# Patient Record
Sex: Female | Born: 1970 | Race: White | Hispanic: No | Smoking: Never smoker
Health system: Southern US, Community
[De-identification: ages and names within clinical notes are randomized; demographics above are authoritative.]

## PROBLEM LIST (undated history)

## (undated) DIAGNOSIS — I10 Essential (primary) hypertension: Secondary | ICD-10-CM

## (undated) DIAGNOSIS — G4730 Sleep apnea, unspecified: Secondary | ICD-10-CM

## (undated) DIAGNOSIS — C801 Malignant (primary) neoplasm, unspecified: Secondary | ICD-10-CM

## (undated) DIAGNOSIS — R51 Headache: Secondary | ICD-10-CM

## (undated) DIAGNOSIS — M1990 Unspecified osteoarthritis, unspecified site: Secondary | ICD-10-CM

## (undated) DIAGNOSIS — R519 Headache, unspecified: Secondary | ICD-10-CM

## (undated) DIAGNOSIS — J189 Pneumonia, unspecified organism: Secondary | ICD-10-CM

## (undated) DIAGNOSIS — K219 Gastro-esophageal reflux disease without esophagitis: Secondary | ICD-10-CM

## (undated) DIAGNOSIS — E079 Disorder of thyroid, unspecified: Secondary | ICD-10-CM

## (undated) DIAGNOSIS — R7303 Prediabetes: Secondary | ICD-10-CM

## (undated) HISTORY — PX: WISDOM TOOTH EXTRACTION: SHX21

## (undated) HISTORY — DX: Disorder of thyroid, unspecified: E07.9

## (undated) HISTORY — DX: Essential (primary) hypertension: I10

---

## 1997-11-05 HISTORY — PX: HAND TENDON SURGERY: SHX663

## 1998-12-27 ENCOUNTER — Ambulatory Visit (HOSPITAL_COMMUNITY): Admission: RE | Admit: 1998-12-27 | Discharge: 1998-12-27 | Payer: Self-pay | Admitting: Orthopedic Surgery

## 1999-03-03 ENCOUNTER — Other Ambulatory Visit: Admission: RE | Admit: 1999-03-03 | Discharge: 1999-03-03 | Payer: Self-pay | Admitting: *Deleted

## 1999-03-21 ENCOUNTER — Other Ambulatory Visit: Admission: RE | Admit: 1999-03-21 | Discharge: 1999-03-21 | Payer: Self-pay | Admitting: *Deleted

## 2000-02-29 ENCOUNTER — Other Ambulatory Visit: Admission: RE | Admit: 2000-02-29 | Discharge: 2000-02-29 | Payer: Self-pay | Admitting: *Deleted

## 2001-02-10 ENCOUNTER — Other Ambulatory Visit: Admission: RE | Admit: 2001-02-10 | Discharge: 2001-02-10 | Payer: Self-pay | Admitting: *Deleted

## 2003-01-15 ENCOUNTER — Other Ambulatory Visit: Admission: RE | Admit: 2003-01-15 | Discharge: 2003-01-15 | Payer: Self-pay | Admitting: Obstetrics and Gynecology

## 2004-03-17 ENCOUNTER — Other Ambulatory Visit: Admission: RE | Admit: 2004-03-17 | Discharge: 2004-03-17 | Payer: Self-pay | Admitting: Obstetrics and Gynecology

## 2005-03-23 ENCOUNTER — Other Ambulatory Visit: Admission: RE | Admit: 2005-03-23 | Discharge: 2005-03-23 | Payer: Self-pay | Admitting: Obstetrics and Gynecology

## 2006-03-26 ENCOUNTER — Other Ambulatory Visit: Admission: RE | Admit: 2006-03-26 | Discharge: 2006-03-26 | Payer: Self-pay | Admitting: Obstetrics & Gynecology

## 2007-04-01 ENCOUNTER — Other Ambulatory Visit: Admission: RE | Admit: 2007-04-01 | Discharge: 2007-04-01 | Payer: Self-pay | Admitting: Obstetrics & Gynecology

## 2007-10-31 ENCOUNTER — Encounter: Admission: RE | Admit: 2007-10-31 | Discharge: 2007-10-31 | Payer: Self-pay | Admitting: Obstetrics and Gynecology

## 2008-04-01 ENCOUNTER — Other Ambulatory Visit: Admission: RE | Admit: 2008-04-01 | Discharge: 2008-04-01 | Payer: Self-pay | Admitting: Obstetrics and Gynecology

## 2008-11-23 ENCOUNTER — Encounter: Admission: RE | Admit: 2008-11-23 | Discharge: 2009-02-21 | Payer: Self-pay | Admitting: Specialist

## 2010-11-26 ENCOUNTER — Encounter: Payer: Self-pay | Admitting: Obstetrics and Gynecology

## 2013-01-23 ENCOUNTER — Other Ambulatory Visit: Payer: Self-pay | Admitting: Family Medicine

## 2013-01-23 DIAGNOSIS — Z1231 Encounter for screening mammogram for malignant neoplasm of breast: Secondary | ICD-10-CM

## 2013-02-18 ENCOUNTER — Ambulatory Visit: Payer: Self-pay

## 2013-11-20 ENCOUNTER — Other Ambulatory Visit: Payer: Self-pay | Admitting: Nurse Practitioner

## 2013-11-20 NOTE — Telephone Encounter (Signed)
S/w pt and she has enough rx to last until her appt on 11/26/13. Told pt we will refill her rx for one year at her visit then. Pt agreed.

## 2013-11-26 ENCOUNTER — Ambulatory Visit (INDEPENDENT_AMBULATORY_CARE_PROVIDER_SITE_OTHER): Payer: BC Managed Care – PPO | Admitting: Nurse Practitioner

## 2013-11-26 ENCOUNTER — Encounter: Payer: Self-pay | Admitting: Nurse Practitioner

## 2013-11-26 VITALS — BP 122/84 | HR 72 | Ht 63.0 in | Wt 287.0 lb

## 2013-11-26 DIAGNOSIS — Z0000 Encounter for general adult medical examination without abnormal findings: Secondary | ICD-10-CM

## 2013-11-26 DIAGNOSIS — Z01419 Encounter for gynecological examination (general) (routine) without abnormal findings: Secondary | ICD-10-CM

## 2013-11-26 DIAGNOSIS — R42 Dizziness and giddiness: Secondary | ICD-10-CM

## 2013-11-26 DIAGNOSIS — I10 Essential (primary) hypertension: Secondary | ICD-10-CM | POA: Insufficient documentation

## 2013-11-26 DIAGNOSIS — R319 Hematuria, unspecified: Secondary | ICD-10-CM

## 2013-11-26 LAB — COMPREHENSIVE METABOLIC PANEL
ALBUMIN: 4 g/dL (ref 3.5–5.2)
ALK PHOS: 61 U/L (ref 39–117)
ALT: 17 U/L (ref 0–35)
AST: 18 U/L (ref 0–37)
BUN: 12 mg/dL (ref 6–23)
CALCIUM: 9.1 mg/dL (ref 8.4–10.5)
CO2: 27 mEq/L (ref 19–32)
Chloride: 102 mEq/L (ref 96–112)
Creat: 0.96 mg/dL (ref 0.50–1.10)
Glucose, Bld: 102 mg/dL — ABNORMAL HIGH (ref 70–99)
POTASSIUM: 4 meq/L (ref 3.5–5.3)
SODIUM: 136 meq/L (ref 135–145)
TOTAL PROTEIN: 7.1 g/dL (ref 6.0–8.3)
Total Bilirubin: 0.4 mg/dL (ref 0.3–1.2)

## 2013-11-26 LAB — TSH: TSH: 3.733 u[IU]/mL (ref 0.350–4.500)

## 2013-11-26 LAB — POCT URINALYSIS DIPSTICK
Bilirubin, UA: NEGATIVE
Glucose, UA: NEGATIVE
Ketones, UA: NEGATIVE
Leukocytes, UA: NEGATIVE
Nitrite, UA: NEGATIVE
PROTEIN UA: NEGATIVE
Urobilinogen, UA: NEGATIVE
pH, UA: 6

## 2013-11-26 LAB — LIPID PANEL
CHOL/HDL RATIO: 3.4 ratio
CHOLESTEROL: 166 mg/dL (ref 0–200)
HDL: 49 mg/dL (ref >39)
LDL CALC: 91 mg/dL (ref 0–99)
Triglycerides: 132 mg/dL (ref <150)
VLDL: 26 mg/dL (ref 0–40)

## 2013-11-26 LAB — HEMOGLOBIN, FINGERSTICK: Hemoglobin, fingerstick: 13.6 g/dL (ref 12.0–16.0)

## 2013-11-26 MED ORDER — NORETHINDRONE 0.35 MG PO TABS: 1.0000 | ORAL_TABLET | Freq: Every day | ORAL | Status: DC
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
## 2013-11-26 NOTE — Patient Instructions (Signed)

## 2013-11-27 ENCOUNTER — Encounter: Payer: Self-pay | Admitting: Nurse Practitioner

## 2013-11-27 LAB — URINE CULTURE
Colony Count: NO GROWTH
ORGANISM ID, BACTERIA: NO GROWTH

## 2013-11-27 LAB — IPS PAP TEST WITH REFLEX TO HPV

## 2013-11-27 LAB — VITAMIN D 25 HYDROXY (VIT D DEFICIENCY, FRACTURES): Vit D, 25-Hydroxy: 24 ng/mL — ABNORMAL LOW (ref 30–89)

## 2013-11-27 NOTE — Progress Notes (Signed)
Encounter reviewed by Dr. Brook Silva.  

## 2013-12-01 ENCOUNTER — Other Ambulatory Visit: Payer: Self-pay | Admitting: Nurse Practitioner

## 2013-12-01 MED ORDER — VITAMIN D (ERGOCALCIFEROL) 1.25 MG (50000 UNIT) PO CAPS: 50000.0000 [IU] | ORAL_CAPSULE | ORAL | Status: DC

## 2014-11-30 ENCOUNTER — Encounter: Payer: Self-pay | Admitting: Nurse Practitioner

## 2014-11-30 ENCOUNTER — Ambulatory Visit (INDEPENDENT_AMBULATORY_CARE_PROVIDER_SITE_OTHER): Payer: BLUE CROSS/BLUE SHIELD | Admitting: Nurse Practitioner

## 2014-11-30 VITALS — BP 144/92 | HR 64 | Ht 62.75 in | Wt 287.0 lb

## 2014-11-30 DIAGNOSIS — Z01419 Encounter for gynecological examination (general) (routine) without abnormal findings: Secondary | ICD-10-CM

## 2014-11-30 DIAGNOSIS — Z0000 Encounter for general adult medical examination without abnormal findings: Secondary | ICD-10-CM

## 2014-11-30 LAB — POCT URINALYSIS DIPSTICK
BILIRUBIN UA: NEGATIVE
GLUCOSE UA: NEGATIVE
KETONES UA: NEGATIVE
Leukocytes, UA: NEGATIVE
Nitrite, UA: NEGATIVE
PH UA: 5
Protein, UA: NEGATIVE
UROBILINOGEN UA: NEGATIVE

## 2014-11-30 LAB — LIPID PANEL
CHOLESTEROL: 176 mg/dL (ref 0–200)
HDL: 50 mg/dL (ref >39)
LDL CALC: 97 mg/dL (ref 0–99)
Total CHOL/HDL Ratio: 3.5 Ratio
Triglycerides: 147 mg/dL (ref <150)
VLDL: 29 mg/dL (ref 0–40)

## 2014-11-30 LAB — COMPREHENSIVE METABOLIC PANEL
ALBUMIN: 3.8 g/dL (ref 3.5–5.2)
ALK PHOS: 78 U/L (ref 39–117)
ALT: 21 U/L (ref 0–35)
AST: 19 U/L (ref 0–37)
BUN: 12 mg/dL (ref 6–23)
CO2: 27 mEq/L (ref 19–32)
CREATININE: 0.81 mg/dL (ref 0.50–1.10)
Calcium: 9.5 mg/dL (ref 8.4–10.5)
Chloride: 103 mEq/L (ref 96–112)
Glucose, Bld: 84 mg/dL (ref 70–99)
Potassium: 4.2 mEq/L (ref 3.5–5.3)
Sodium: 140 mEq/L (ref 135–145)
Total Bilirubin: 0.4 mg/dL (ref 0.2–1.2)
Total Protein: 7 g/dL (ref 6.0–8.3)

## 2014-11-30 LAB — HEMOGLOBIN A1C
Hgb A1c MFr Bld: 5.8 % — ABNORMAL HIGH (ref <5.7)
Mean Plasma Glucose: 120 mg/dL — ABNORMAL HIGH (ref <117)

## 2014-11-30 LAB — TSH: TSH: 2.67 u[IU]/mL (ref 0.350–4.500)

## 2014-11-30 NOTE — Patient Instructions (Signed)
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
## 2014-12-01 LAB — HEMOGLOBIN, FINGERSTICK: HEMOGLOBIN, FINGERSTICK: 13.7 g/dL (ref 12.0–16.0)

## 2014-12-01 LAB — VITAMIN D 25 HYDROXY (VIT D DEFICIENCY, FRACTURES): VIT D 25 HYDROXY: 18 ng/mL — AB (ref 30–100)

## 2014-12-02 LAB — IPS PAP TEST WITH HPV

## 2014-12-02 NOTE — Progress Notes (Signed)
Feel pt should have PUS and possible endometrial biopsy due to risks for endometrial hyperplasia.  Reviewed personally.  Felipa Emory, MD.

## 2014-12-03 MED ORDER — VITAMIN D (ERGOCALCIFEROL) 1.25 MG (50000 UNIT) PO CAPS: 50000.0000 [IU] | ORAL_CAPSULE | ORAL | Status: DC

## 2014-12-03 NOTE — Addendum Note (Signed)
Addended by: Graylon Good on: 12/03/2014 01:34 PM   Modules accepted: Orders, SmartSet

## 2015-01-03 ENCOUNTER — Telehealth: Payer: Self-pay | Admitting: Nurse Practitioner

## 2015-01-03 NOTE — Telephone Encounter (Signed)
Patient is called about recommendation for a PUS and endo biopsy per Dr. Sabra Heck,  given her risk for endo hyperplasia.  She went off POP and did not want further hormonal intervention.  She was left a message to call back.

## 2015-01-04 ENCOUNTER — Other Ambulatory Visit: Payer: Self-pay | Admitting: Nurse Practitioner

## 2015-01-04 DIAGNOSIS — N926 Irregular menstruation, unspecified: Secondary | ICD-10-CM

## 2015-01-04 NOTE — Telephone Encounter (Signed)
Patient called back and we discussed the PUS and endo biopsy.  She was not very happy to proceed if the cost was high.  She is willing to keep a menses record and report back in 6 months.  She is aware that our main concern is endometrial cancer with her not being on POP.  She allowed me to put the order in and will see if insurance covers.

## 2015-01-10 NOTE — Telephone Encounter (Signed)
Would recommend trying to schedule a 6 month follow up if pt does not proceed with PUS and biopsy instead of having her call.

## 2015-01-31 ENCOUNTER — Telehealth: Payer: Self-pay | Admitting: Nurse Practitioner

## 2015-01-31 NOTE — Telephone Encounter (Signed)
Left message that we needed a 6 months apt. for a follow up.  Colletta Maryland will also try and reach her tomorrow.

## 2015-02-01 NOTE — Telephone Encounter (Signed)
Left message again.

## 2015-03-03 NOTE — Telephone Encounter (Signed)
I have left a message several times for this pt and no answer or call back. Will close this encounter.

## 2015-05-24 ENCOUNTER — Telehealth: Payer: Self-pay | Admitting: Nurse Practitioner

## 2015-05-24 NOTE — Telephone Encounter (Signed)
Spoke with patient. Patient states that since January she has had two cycles. One in May and one in June. Advised patient I will provide Donna Cage, FNP and update on how she has been doing and return call with any further recommendations. Patient states " I really do not see myself coming in until next January for my annual. You can put it in my file and talk to her but I really do not want to come in before then."

## 2015-05-24 NOTE — Telephone Encounter (Addendum)
-----   Message from Megan Salon, MD sent at 01/10/2015  9:41 PM EST ----- Regarding: RE: PUS  I would have her schedule a 6 month follow up instead of relying on her to call back.  That's my suggestion anyway.

## 2015-12-02 ENCOUNTER — Ambulatory Visit: Payer: BLUE CROSS/BLUE SHIELD | Admitting: Nurse Practitioner

## 2016-03-22 ENCOUNTER — Telehealth: Payer: Self-pay | Admitting: *Deleted

## 2016-03-22 NOTE — Telephone Encounter (Signed)
Message left for patient to return call to [place] at [phone] on voicemail (home/mobile). [phone] (H)  RE: scheduling annual exam (due 11/2015) and mammogram.

## 2016-03-22 NOTE — Telephone Encounter (Signed)
-----   Message from Kem Boroughs, [place] sent at 02/06/2016  8:26 AM EDT ----- Please se if Mammo has been done and we just don't have a copy. ----- Message -----    From: SYSTEM    Sent: 02/04/2016  12:05 AM      To: Kem Boroughs, FNP

## 2016-04-16 NOTE — Telephone Encounter (Signed)
Left voicemail for pt re: scheduling AEX and MMG.

## 2016-04-23 NOTE — Telephone Encounter (Signed)
Letter sent today. Per Mrs Chong Sicilian.

## 2016-07-17 ENCOUNTER — Ambulatory Visit (INDEPENDENT_AMBULATORY_CARE_PROVIDER_SITE_OTHER): Payer: BLUE CROSS/BLUE SHIELD | Admitting: Obstetrics & Gynecology

## 2016-07-17 ENCOUNTER — Encounter: Payer: Self-pay | Admitting: Nurse Practitioner

## 2016-07-17 ENCOUNTER — Other Ambulatory Visit: Payer: Self-pay | Admitting: *Deleted

## 2016-07-17 ENCOUNTER — Other Ambulatory Visit: Payer: Self-pay | Admitting: Obstetrics & Gynecology

## 2016-07-17 ENCOUNTER — Ambulatory Visit (INDEPENDENT_AMBULATORY_CARE_PROVIDER_SITE_OTHER): Payer: BLUE CROSS/BLUE SHIELD

## 2016-07-17 ENCOUNTER — Ambulatory Visit (INDEPENDENT_AMBULATORY_CARE_PROVIDER_SITE_OTHER): Payer: BLUE CROSS/BLUE SHIELD | Admitting: Nurse Practitioner

## 2016-07-17 VITALS — BP 152/96 | HR 60 | Ht 62.25 in | Wt 296.0 lb

## 2016-07-17 DIAGNOSIS — R1011 Right upper quadrant pain: Secondary | ICD-10-CM | POA: Diagnosis not present

## 2016-07-17 DIAGNOSIS — R938 Abnormal findings on diagnostic imaging of other specified body structures: Secondary | ICD-10-CM

## 2016-07-17 DIAGNOSIS — N939 Abnormal uterine and vaginal bleeding, unspecified: Secondary | ICD-10-CM

## 2016-07-17 DIAGNOSIS — Z01419 Encounter for gynecological examination (general) (routine) without abnormal findings: Secondary | ICD-10-CM

## 2016-07-17 DIAGNOSIS — I10 Essential (primary) hypertension: Secondary | ICD-10-CM | POA: Diagnosis not present

## 2016-07-17 DIAGNOSIS — R9389 Abnormal findings on diagnostic imaging of other specified body structures: Secondary | ICD-10-CM

## 2016-07-17 DIAGNOSIS — Z0000 Encounter for general adult medical examination without abnormal findings: Secondary | ICD-10-CM | POA: Diagnosis not present

## 2016-07-17 DIAGNOSIS — N938 Other specified abnormal uterine and vaginal bleeding: Secondary | ICD-10-CM

## 2016-07-17 DIAGNOSIS — N8502 Endometrial intraepithelial neoplasia [EIN]: Secondary | ICD-10-CM

## 2016-07-17 LAB — POCT URINALYSIS DIPSTICK
BILIRUBIN UA: NEGATIVE
GLUCOSE UA: NEGATIVE
KETONES UA: NEGATIVE
Leukocytes, UA: NEGATIVE
Nitrite, UA: NEGATIVE
Urobilinogen, UA: NEGATIVE
pH, UA: 5.5
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
## 2016-07-17 NOTE — Patient Instructions (Signed)

## 2016-07-17 NOTE — Progress Notes (Signed)
45 y.o. Donna Melendez here for a pelvic ultrasound with sonohystogram due to continued irregular bleeding for over 6 months.  Pt was seen earlier in the day by Kem Boroughs.  Ultrasound and possible biopsy was recommended over a year ago due to bleeding pattern and her obesity.  Pt declined at that time.  Bleeding pattern has worsened and Kem Boroughs, FNP, was concerned if pt did not have additional evaluation today that she would not return for many more months.  Pt reports at times her bleeding has a really unusual odor to it.  She was previously on POP but has stopped them.    Patient's last menstrual period was 05/18/2016 (exact date).  Contraception: vasectomy  Technique:  Both transabdominal and transvaginal ultrasound examinations of the pelvis were performed. Transabdominal technique was performed for global imaging of the pelvis including uterus, ovaries, adnexal regions, and pelvic cul-de-sac.  It was necessary to proceed with endovaginal exam following the abdominal ultrasound transabdominal exam to visualize the endometrium and adnexa.  Color and duplex Doppler ultrasound was utilized to evaluate blood flow to the ovaries.   FINDINGS: Uterus: 9.2 x 4.8 x 4.0cm Endometrium: 18.64mm, appears to be an endometrial lesion present Adnexa:  Left: 2.9 x 2.0 x 1.5cm     Right: 3.2 x 1.9 x 1.4cm Cul de sac: no free fluid  SHSG:  Because of appearance of endometrium, recommended pt proceed with SHGM to determine if there is a polyp or if this is just thickened endometrium.  After obtaining appropriate verbal consent from patient, the cervix was visualized using a speculum, and prepped with betadine.  A tenaculum  was applied to the cervix.  Dilation of the cervix was not necessary. The catheter was passed into the uterus and sterile saline introduced, with the following findings:  No intracavity lesion noted, just echogenic thickened endometrium.  Endometrial biopsy recommended. Consent  obtained.  Speculum placed.  Cervix visualized and cleansed with betadine prep.  A single toothed tenaculum was applied to the anterior lip of the cervix.  Endometrial pipelle was advanced through the cervix into the endometrial cavity without difficulty.  Pipelle passed to 9cm.  Suction applied and pipelle removed with good tissue sample obtained.  Two passes returned because tissue also appeared to have blood associated with it.  Tenculum removed.  Minimal bleeding noted.  Patient tolerated procedure well.  All instruments removed.  Assessment:  45 yo G0 with 5mm thickened, echogenic endometrium Obesity Irregular bleeding  Plan:  Pt will be informed of results.  Likely will need additional procedure like D&C if pathology is negative or with hyperplasia to ensure no additional abnormality is present due to thickness of endometrium today.  Have already discussed this with pt who states "I have some vacation in December and I'd like to wait until then".  I have discussed with pt whatever is needed, it is likely that doing it sooner than December will be much more advisable.    ~15 minutes spent with patient >50% of time was in face to face discussion of above.

## 2016-07-17 NOTE — Progress Notes (Signed)
Reviewed personally.  M. Suzanne Lurie Mullane, MD.  

## 2016-07-18 ENCOUNTER — Encounter: Payer: Self-pay | Admitting: Obstetrics & Gynecology

## 2016-07-18 DIAGNOSIS — R9389 Abnormal findings on diagnostic imaging of other specified body structures: Secondary | ICD-10-CM | POA: Insufficient documentation

## 2016-07-18 LAB — LIPID PANEL
CHOL/HDL RATIO: 3.9 ratio (ref <=5.0)
Cholesterol: 204 mg/dL — ABNORMAL HIGH (ref 125–200)
HDL: 52 mg/dL (ref >=46)
LDL CALC: 130 mg/dL — AB (ref <130)
Triglycerides: 112 mg/dL (ref <150)
VLDL: 22 mg/dL (ref <30)

## 2016-07-18 LAB — COMPREHENSIVE METABOLIC PANEL
ALBUMIN: 4.1 g/dL (ref 3.6–5.1)
ALT: 30 U/L — ABNORMAL HIGH (ref 6–29)
AST: 24 U/L (ref 10–30)
Alkaline Phosphatase: 80 U/L (ref 33–115)
BUN: 13 mg/dL (ref 7–25)
CHLORIDE: 104 mmol/L (ref 98–110)
CO2: 27 mmol/L (ref 20–31)
CREATININE: 0.87 mg/dL (ref 0.50–1.10)
Calcium: 9.9 mg/dL (ref 8.6–10.2)
Glucose, Bld: 87 mg/dL (ref 65–99)
POTASSIUM: 4 mmol/L (ref 3.5–5.3)
SODIUM: 141 mmol/L (ref 135–146)
TOTAL PROTEIN: 7.6 g/dL (ref 6.1–8.1)
Total Bilirubin: 0.6 mg/dL (ref 0.2–1.2)

## 2016-07-18 LAB — VITAMIN D 25 HYDROXY (VIT D DEFICIENCY, FRACTURES): Vit D, 25-Hydroxy: 19 ng/mL — ABNORMAL LOW (ref 30–100)

## 2016-07-18 LAB — HEMOGLOBIN, FINGERSTICK: Hemoglobin, fingerstick: 14.5 g/dL (ref 12.0–16.0)

## 2016-07-18 LAB — CBC
HEMATOCRIT: 43.7 % (ref 35.0–45.0)
Hemoglobin: 14.4 g/dL (ref 11.7–15.5)
MCH: 28.5 pg (ref 27.0–33.0)
MCHC: 33 g/dL (ref 32.0–36.0)
MCV: 86.4 fL (ref 80.0–100.0)
MPV: 9.8 fL (ref 7.5–12.5)
Platelets: 325 10*3/uL (ref 140–400)
RBC: 5.06 MIL/uL (ref 3.80–5.10)
RDW: 13.9 % (ref 11.0–15.0)
WBC: 10.9 10*3/uL — AB (ref 3.8–10.8)

## 2016-07-18 LAB — TSH: TSH: 3.81 mIU/L

## 2016-07-18 LAB — HEMOGLOBIN A1C
HEMOGLOBIN A1C: 5.8 % — AB (ref <5.7)
Mean Plasma Glucose: 120 mg/dL

## 2016-07-19 ENCOUNTER — Ambulatory Visit: Payer: BLUE CROSS/BLUE SHIELD | Admitting: Obstetrics & Gynecology

## 2016-07-19 MED ORDER — VITAMIN D (ERGOCALCIFEROL) 1.25 MG (50000 UNIT) PO CAPS: 50000.0000 [IU] | ORAL_CAPSULE | ORAL | 1 refills | Status: DC

## 2016-07-19 NOTE — Addendum Note (Signed)
Addended by: Graylon Good on: 07/19/2016 09:31 AM   Modules accepted: Orders

## 2016-07-20 LAB — IPS PAP TEST WITH HPV

## 2016-08-01 ENCOUNTER — Telehealth: Payer: Self-pay

## 2016-08-01 NOTE — Telephone Encounter (Signed)
-----   Message from Megan Salon, MD sent at 08/01/2016  6:31 AM EDT ----- Finally was able to reach pt on phone during time she was able to talk.  D/w pt findings and that although this did not show endometrial cancer, there are concerning findings for this.  Risk is at least 25% that malignancy is present.  Likely this is higher with her.  Also, endometrium was 36mm on ultrasound and there is much more tissue present that could have malignancy in it.  Pt and I discussed proceeding with D&C for definitive diagnosis vs referral to gyn/onc for consultation for hysterectomy.  She would have frozen section and then proceed with staging if necessary.  As she is 60, this would mean removal of ovaries as well.  She is not desirous of HRT, if possible.  Pt asks "why can't you just jerk it out and then I'll have another surgery if needed for staging?".  Advised recovery would be someone similar and she is already worried about having to take time off from work so would not recommend this approach.  Pt voices that she only has enough days left of vacation for her Thanksgiving and Christmas holidays so she is NOT going to have this done until December.  Advised of typical standard of care.  Pt is aware but is adamant about timing of surgery.  At least 45 minutes spent on phone with pt answering questions.  Referral to gyn/onc placed.  Riley Kill, will you please call for appt for no earlier than next week.  Pt states she's "too busy" this week to go.  Declines appt that is earlier than 10am.  Will CC to Kem Boroughs.

## 2016-08-01 NOTE — Addendum Note (Signed)
Addended by: Megan Salon on: 08/01/2016 06:33 AM   Modules accepted: Orders

## 2016-08-01 NOTE — Telephone Encounter (Signed)
Left message with the scheduling department at [facility] at [facility] with request to return call regarding scheduling a patient appointment with [facility].

## 2016-08-02 NOTE — Telephone Encounter (Signed)
Patient would like to cancel her referral to [facility].  Patient no longer wants this referral.

## 2016-08-02 NOTE — Telephone Encounter (Signed)
Spoke with patient. Patient would like to cancel referral to Dr.Rossi's office at this time. Patient states that she has decided that she does not want to proceed with any further evaluation at this time. "I am not concerned that I have cancer and I do not want to do anything further." Advised patient there is at least 25 % chance that malignancy is present and that per Dr.Miller this is likely higher with her. Advised it is against our recommendation not to proceed with any evaluation. Strongly encouraged patient to proceed with evaluation. Patient declines. "I am set in my decision and it is not going to change." Advised I will let Dr.Miller know that she is declining further evaluation at this time.  Dr.Miller, please advise. I have not yet cancelled referral to [facility], but can if needed.

## 2016-08-07 ENCOUNTER — Telehealth: Payer: Self-pay | Admitting: Nurse Practitioner

## 2016-08-07 NOTE — Telephone Encounter (Signed)
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  Offered that since she has 3 weeks at christmas to do the [facility] then.  She wanted more definitive treatment and knew that 3 weeks was not enough time to recover.  But also states her vacation times starts all over first of year.  She has great concerns about out of pocket cost and does not want to pursue at this time. (Current bill is more than she can pay).   Again made aware that at her age we were concerned about a cancer that could take her life.  She states she is not afraid or an anxious person and has no concerns about cancer.  I did offer to remake her apt with Dr. Denman George to review options and discuss surgery more - she declines.  She is very appreciative of our call and concern for her.  I have asked her to call if any thing changes or any problems.

## 2016-08-08 NOTE — Telephone Encounter (Signed)
Certified letter will be sent.  Encounter closed.

## 2016-10-09 ENCOUNTER — Telehealth: Payer: Self-pay | Admitting: Obstetrics & Gynecology

## 2016-10-09 NOTE — Telephone Encounter (Signed)
Call to [place] office for appointment, left message on voice mail requesting first available appointment.

## 2016-10-09 NOTE — Telephone Encounter (Signed)
Patient wants to know if she can schedule surgery before Christmas.

## 2016-10-09 NOTE — Telephone Encounter (Signed)
Pt had biopsy in September showing "at least complex endometrial hyperplasia with atypia".  Pathologist was very concerned it was more than this.  I spoke with pt personally.  This is the note from my phone call with here that is documented on her pathology report:  "Finally was able to reach pt on phone during time she was able to talk. D/w pt findings and that although this did not show endometrial cancer, there are concerning findings for this. Risk is at least 25% that malignancy is present. Likely this is higher with her. Also, endometrium was 18mm on ultrasound and there is much more tissue present that could have malignancy in it. Pt and I discussed proceeding with D&C for definitive diagnosis vs referral to gyn/onc for consultation for hysterectomy. She would have frozen section and then proceed with staging if necessary. As she is 19, this would mean removal of ovaries as well. She is not desirous of HRT, if possible. Pt asks "why can't you just jerk it out and then I'll have another surgery if needed for staging?". Advised recovery would be someone similar and she is already worried about having to take time off from work so would not recommend this approach. Pt voices that she only has enough days left of vacation for her Thanksgiving and Christmas holidays so she is NOT going to have this done until December. Advised of typical standard of care. Pt is aware but is adamant about timing of surgery. At least 45 minutes spent on phone with pt answering questions."  Pt later called and declined to do anything.  She was called an additional time and, again, declined.    So, she needs to see oncology because she is likely going to have pelvic LND done while waiting on frozen section.  She will not have a D&C for definitive diagnosis before hysterectomy.  We will need to explain this to gyn/onc.

## 2016-10-09 NOTE — Telephone Encounter (Signed)
Call to patient. She states she is ready to proceed with hysterectomy as soon as possible.  Has only had about 5 days of no bleeding since last appointment here in September.  Initially did not want to proceed with surgery but states bleeding has continued and she is ready. She is closer to deductible and has "enough" time off work.  Needs to proceed with surgery before Christmas to maximize time off for recovery. Advised patient that with limited time left, this may not be a viable option. Advised will review with MD for instructions and call her back.

## 2016-10-10 NOTE — Telephone Encounter (Signed)
Spoke to [place] at Dr Serita Grit office, first available appointment is 10-22-16 at 0900. She will review clinical info with [facility] tomorrow and call back if any changes.   Call to patient at approximately 1650. Advised of appointment with Dr Denman George on 10-22-16 at 0900. This is first available appointment at this time. If able to provide any additional information or changes, they will call back. Patient is aware of appointment date and time and initially declined due to work. Advised that if desires surgery quickly, will need to take appointment when available but she is welcome to call to reschedule. Patient stated she will keep appointment.  Routing to provider for final review. Patient agreeable to disposition. Will close encounter.

## 2016-10-10 NOTE — Telephone Encounter (Signed)
2nd call to [place] for appointment. Left message to call back.

## 2016-10-16 ENCOUNTER — Telehealth: Payer: Self-pay | Admitting: Obstetrics & Gynecology

## 2016-10-16 NOTE — Telephone Encounter (Signed)
Spoke with patient. Patient calling for location of appt with Dr. Denman George on 10/22/16. Provided patient with address to [place] at [facility] and advised to stop at desk in main lobby for further instructions for check-in. Patient asking if Dr. Denman George will need any additional records? Advised patient Dr. Denman George will have information that is needed prior to visit. Patient verbalizes understanding and is agreeable.  Routing to provider for final review. Patient is agreeable to disposition. Will close encounter.   Cc: Jaymes Graff

## 2016-10-16 NOTE — Telephone Encounter (Signed)
Patient is asking to talk with Dr.Miller's nurse about the referral that was made for her by our office.

## 2016-10-18 ENCOUNTER — Telehealth: Payer: Self-pay | Admitting: *Deleted

## 2016-10-18 DIAGNOSIS — E559 Vitamin D deficiency, unspecified: Secondary | ICD-10-CM

## 2016-10-18 NOTE — Telephone Encounter (Signed)
-----   Message from Graylon Good, [place] sent at 07/19/2016 10:30 AM EDT ----- Regarding: repeat labs Needs vit d recheck 3 months from 07/19/16. meds went to mail order pharm.

## 2016-10-18 NOTE — Telephone Encounter (Signed)
I spoke to the patient to let her know it is time to repeat her Vitamin D.  Donna Melendez is scheduled to see Dr. Denman George on the 18th.  Patient is given the option to schedule at our office to have labs drawn or to wait until appointment on Monday to see if any labs will be drawn and if Vitamin D can be added.  Patient opts to wait until Monday.  Order entered for repeat Vit D.  Routing to provider for review.  Signing encounter.

## 2016-10-22 ENCOUNTER — Encounter: Payer: Self-pay | Admitting: Gynecologic Oncology

## 2016-10-22 ENCOUNTER — Ambulatory Visit: Payer: BLUE CROSS/BLUE SHIELD | Attending: Gynecologic Oncology | Admitting: Gynecologic Oncology

## 2016-10-22 VITALS — BP 213/128 | HR 103 | Temp 98.7°F | Resp 18 | Ht 62.25 in | Wt 294.6 lb

## 2016-10-22 DIAGNOSIS — N970 Female infertility associated with anovulation: Secondary | ICD-10-CM | POA: Insufficient documentation

## 2016-10-22 DIAGNOSIS — N939 Abnormal uterine and vaginal bleeding, unspecified: Secondary | ICD-10-CM | POA: Insufficient documentation

## 2016-10-22 DIAGNOSIS — C541 Malignant neoplasm of endometrium: Secondary | ICD-10-CM | POA: Diagnosis not present

## 2016-10-22 DIAGNOSIS — N8502 Endometrial intraepithelial neoplasia [EIN]: Secondary | ICD-10-CM

## 2016-10-22 NOTE — Patient Instructions (Signed)
Preparing for your Surgery  Plan for surgery on 10/30/16 with Dr. Everitt Amber Robotic Total hysterectomy with bilateral salpingo-oophorectomy with sentinel lympnode biopsy  Pre-operative Testing -You will receive a phone call from presurgical testing at [facility] to arrange for a pre-operative testing appointment before your surgery.  This appointment normally occurs one to two weeks before your scheduled surgery.   -Bring your insurance card, copy of an advanced directive if applicable, medication list  -At that visit, you will be asked to sign a consent for a possible blood transfusion in case a transfusion becomes necessary during surgery.  The need for a blood transfusion is rare but having consent is a necessary part of your care.     -You should not be taking blood thinners or aspirin at least ten days prior to surgery unless instructed by your surgeon.  Day Before Surgery at [place] will be asked to take in a light diet the day before surgery.  Avoid carbonated beverages.  You will be advised to have nothing to eat or drink after midnight the evening before.     Eat a light diet the day before surgery.  Examples including soups, broths,  toast, yogurt, mashed potatoes.  Things to avoid include carbonated beverages  (fizzy beverages), raw fruits and raw vegetables, or beans.    If your bowels are filled with gas, your surgeon will have difficulty  visualizing your pelvic organs which increases your surgical risks.  Your role in recovery Your role is to become active as soon as directed by your doctor, while still giving yourself time to heal.  Rest when you feel tired. You will be asked to do the following in order to speed your recovery:  - Cough and breathe deeply. This helps toclear and expand your lungs and can prevent pneumonia. You may be given a spirometer to practice deep breathing. A staff member will show you how to use the spirometer. - Do mild  physical activity. Walking or moving your legs help your circulation and body functions return to normal. A staff member will help you when you try to walk and will provide you with simple exercises. Do not try to get up or walk alone the first time. - Actively manage your pain. Managing your pain lets you move in comfort. We will ask you to rate your pain on a scale of zero to 10. It is your responsibility to tell your doctor or nurse where and how much you hurt so your pain can be treated.  Special Considerations -If you are diabetic, you may be placed on insulin after surgery to have closer control over your blood sugars to promote healing and recovery.  This does not mean that you will be discharged on insulin.  If applicable, your oral antidiabetics will be resumed when you are tolerating a solid diet.  -Your final pathology results from surgery should be available by the Friday after surgery and the results will be relayed to you when available.  Eat a light diet the day before surgery.  Examples including soups, broths, toast, yogurt, mashed potatoes.  Things to avoid include carbonated beverages (fizzy beverages), raw fruits and raw vegetables, or beans.   If your bowels are filled with gas, your surgeon will have difficulty visualizing your pelvic organs which increases your surgical risks. Blood Transfusion Information WHAT IS A BLOOD TRANSFUSION? A transfusion is the replacement of blood or some of its parts. Blood is made up of multiple cells  which provide different functions.  Red blood cells carry oxygen and are used for blood loss replacement.  White blood cells fight against infection.  Platelets control bleeding.  Plasma helps clot blood.  Other blood products are available for specialized needs, such as hemophilia or other clotting disorders. BEFORE THE TRANSFUSION  Who gives blood for transfusions?   You may be able to donate blood to be used at a later date on yourself  (autologous donation).  Relatives can be asked to donate blood. This is generally not any safer than if you have received blood from a stranger. The same precautions are taken to ensure safety when a relative's blood is donated.  Healthy volunteers who are fully evaluated to make sure their blood is safe. This is blood bank blood. Transfusion therapy is the safest it has ever been in the practice of medicine. Before blood is taken from a donor, a complete history is taken to make sure that person has no history of diseases nor engages in risky social behavior (examples are intravenous drug use or sexual activity with multiple partners). The donor's travel history is screened to minimize risk of transmitting infections, such as malaria. The donated blood is tested for signs of infectious diseases, such as HIV and hepatitis. The blood is then tested to be sure it is compatible with you in order to minimize the chance of a transfusion reaction. If you or a relative donates blood, this is often done in anticipation of surgery and is not appropriate for emergency situations. It takes many days to process the donated blood. RISKS AND COMPLICATIONS Although transfusion therapy is very safe and saves many lives, the main dangers of transfusion include:   Getting an infectious disease.  Developing a transfusion reaction. This is an allergic reaction to something in the blood you were given. Every precaution is taken to prevent this. The decision to have a blood transfusion has been considered carefully by your caregiver before blood is given. Blood is not given unless the benefits outweigh the risks.
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
## 2016-10-23 NOTE — Progress Notes (Signed)
Scheduling pre op- please PLACE SURGICAL ORDERS IN EPIC  Thanks 

## 2016-10-23 NOTE — Patient Instructions (Signed)
Donna Melendez  10/23/2016   Your procedure is scheduled on: 10/30/2016    Report to [facility] Main  Entrance take [place]  elevators to 3rd floor to  [place] at   [place] AM.  Call this number if you have problems the morning of surgery [phone]   Remember: ONLY 1 PERSON MAY GO WITH YOU TO SHORT STAY TO GET  READY MORNING OF YOUR SURGERY.  Do not eat food or drink liquids :After Midnight.             Eat a light diet the day before surgery.  Examples include: soups, broths, toast, yogurt and mashed potatoes.  Things to avoid include carbonated beverages, raw fruits and vegetables and beans.       Take these medicines the morning of surgery with A SIP OF WATER: Propanolol ( Inderal)                                 You may not have any metal on your body including hair pins and              piercings  Do not wear jewelry, make-up, lotions, powders or perfumes, deodorant             Do not wear nail polish.  Do not shave  48 hours prior to surgery.               Do not bring valuables to the hospital. [place].  Contacts, dentures or bridgework may not be worn into surgery.  Leave suitcase in the car. After surgery it may be brought to your room.       Special Instructions: coughing and deep breathing exercises, leg exercises               Please read over the following fact sheets you were given: _____________________________________________________________________             [facility] - Preparing for Surgery Before surgery, you can play an important role.  Because skin is not sterile, your skin needs to be as free of germs as possible.  You can reduce the number of germs on your skin by washing with CHG (chlorahexidine gluconate) soap before surgery.  CHG is an antiseptic cleaner which kills germs and bonds with the skin to continue killing germs even after washing. Please DO NOT use if you  have an allergy to CHG or antibacterial soaps.  If your skin becomes reddened/irritated stop using the CHG and inform your nurse when you arrive at Short Stay. Do not shave (including legs and underarms) for at least 48 hours prior to the first CHG shower.  You may shave your face/neck. Please follow these instructions carefully:  1.  Shower with CHG Soap the night before surgery and the  morning of Surgery.  2.  If you choose to wash your hair, wash your hair first as usual with your  normal  shampoo.  3.  After you shampoo, rinse your hair and body thoroughly to remove the  shampoo.                           4.  Use CHG as you would any other liquid soap.  You can apply chg directly  to the skin and wash                       Gently with a scrungie or clean washcloth.  5.  Apply the CHG Soap to your body ONLY FROM THE NECK DOWN.   Do not use on face/ open                           Wound or open sores. Avoid contact with eyes, ears mouth and genitals (private parts).                       Wash face,  Genitals (private parts) with your normal soap.             6.  Wash thoroughly, paying special attention to the area where your surgery  will be performed.  7.  Thoroughly rinse your body with warm water from the neck down.  8.  DO NOT shower/wash with your normal soap after using and rinsing off  the CHG Soap.                9.  Pat yourself dry with a clean towel.            10.  Wear clean pajamas.            11.  Place clean sheets on your bed the night of your first shower and do not  sleep with pets. Day of Surgery : Do not apply any lotions/deodorants the morning of surgery.  Please wear clean clothes to the hospital/surgery center.  FAILURE TO FOLLOW THESE INSTRUCTIONS MAY RESULT IN THE CANCELLATION OF YOUR SURGERY PATIENT SIGNATURE_________________________________  NURSE  SIGNATURE__________________________________  ________________________________________________________________________  WHAT IS A BLOOD TRANSFUSION? Blood Transfusion Information  A transfusion is the replacement of blood or some of its parts. Blood is made up of multiple cells which provide different functions.  Red blood cells carry oxygen and are used for blood loss replacement.  White blood cells fight against infection.  Platelets control bleeding.  Plasma helps clot blood.  Other blood products are available for specialized needs, such as hemophilia or other clotting disorders. BEFORE THE TRANSFUSION  Who gives blood for transfusions?   Healthy volunteers who are fully evaluated to make sure their blood is safe. This is blood bank blood. Transfusion therapy is the safest it has ever been in the practice of medicine. Before blood is taken from a donor, a complete history is taken to make sure that person has no history of diseases nor engages in risky social behavior (examples are intravenous drug use or sexual activity with multiple partners). The donor's travel history is screened to minimize risk of transmitting infections, such as malaria. The donated blood is tested for signs of infectious diseases, such as HIV and hepatitis. The blood is then tested to be sure it is compatible with you in order to minimize the chance of a transfusion reaction. If you or a relative donates blood, this is often done in anticipation of surgery and is not appropriate for emergency situations. It takes many days to process the donated blood. RISKS AND COMPLICATIONS Although transfusion therapy is very safe and saves many lives, the main dangers of transfusion include:   Getting an infectious disease.  Developing a transfusion reaction. This is an  allergic reaction to something in the blood you were given. Every precaution is taken to prevent this. The decision to have a blood transfusion has been  considered carefully by your caregiver before blood is given. Blood is not given unless the benefits outweigh the risks. AFTER THE TRANSFUSION  Right after receiving a blood transfusion, you will usually feel much better and more energetic. This is especially true if your red blood cells have gotten low (anemic). The transfusion raises the level of the red blood cells which carry oxygen, and this usually causes an energy increase.  The nurse administering the transfusion will monitor you carefully for complications. HOME CARE INSTRUCTIONS  No special instructions are needed after a transfusion. You may find your energy is better. Speak with your caregiver about any limitations on activity for underlying diseases you may have. SEEK MEDICAL CARE IF:   Your condition is not improving after your transfusion.  You develop redness or irritation at the intravenous (IV) site. SEEK IMMEDIATE MEDICAL CARE IF:  Any of the following symptoms occur over the next 12 hours:  Shaking chills.  You have a temperature by mouth above 102 F (38.9 C), not controlled by medicine.  Chest, back, or muscle pain.  People around you feel you are not acting correctly or are confused.  Shortness of breath or difficulty breathing.  Dizziness and fainting.  You get a rash or develop hives.  You have a decrease in urine output.  Your urine turns a dark color or changes to pink, red, or brown. Any of the following symptoms occur over the next 10 days:  You have a temperature by mouth above 102 F (38.9 C), not controlled by medicine.  Shortness of breath.  Weakness after normal activity.  The white part of the eye turns yellow (jaundice).  You have a decrease in the amount of urine or are urinating less often.  Your urine turns a dark color or changes to pink, red, or brown. Document Released: 10/19/2000 Document Revised: 01/14/2012 Document Reviewed: 06/07/2008 ExitCare Patient Information 2014  [place].  _______________________________________________________________________  Incentive Spirometer  An incentive spirometer is a tool that can help keep your lungs clear and active. This tool measures how well you are filling your lungs with each breath. Taking long deep breaths may help reverse or decrease the chance of developing breathing (pulmonary) problems (especially infection) following:  A long period of time when you are unable to move or be active. BEFORE THE PROCEDURE   If the spirometer includes an indicator to show your best effort, your nurse or respiratory therapist will set it to a desired goal.  If possible, sit up straight or lean slightly forward. Try not to slouch.  Hold the incentive spirometer in an upright position. INSTRUCTIONS FOR USE  1. Sit on the edge of your bed if possible, or sit up as far as you can in bed or on a chair. 2. Hold the incentive spirometer in an upright position. 3. Breathe out normally. 4. Place the mouthpiece in your mouth and seal your lips tightly around it. 5. Breathe in slowly and as deeply as possible, raising the piston or the ball toward the top of the column. 6. Hold your breath for 3-5 seconds or for as long as possible. Allow the piston or ball to fall to the bottom of the column. 7. Remove the mouthpiece from your mouth and breathe out normally. 8. Rest for a few seconds and repeat Steps 1 through 7  at least 10 times every 1-2 hours when you are awake. Take your time and take a few normal breaths between deep breaths. 9. The spirometer may include an indicator to show your best effort. Use the indicator as a goal to work toward during each repetition. 10. After each set of 10 deep breaths, practice coughing to be sure your lungs are clear. If you have an incision (the cut made at the time of surgery), support your incision when coughing by placing a pillow or rolled up towels firmly against it. Once you are able to get  out of bed, walk around indoors and cough well. You may stop using the incentive spirometer when instructed by your caregiver.  RISKS AND COMPLICATIONS  Take your time so you do not get dizzy or light-headed.  If you are in pain, you may need to take or ask for pain medication before doing incentive spirometry. It is harder to take a deep breath if you are having pain. AFTER USE  Rest and breathe slowly and easily.  It can be helpful to keep track of a log of your progress. Your caregiver can provide you with a simple table to help with this. If you are using the spirometer at home, follow these instructions: [place] IF:   You are having difficultly using the spirometer.  You have trouble using the spirometer as often as instructed.  Your pain medication is not giving enough relief while using the spirometer.  You develop fever of 100.5 F (38.1 C) or higher. SEEK IMMEDIATE MEDICAL CARE IF:   You cough up bloody sputum that had not been present before.  You develop fever of 102 F (38.9 C) or greater.  You develop worsening pain at or near the incision site. MAKE SURE YOU:   Understand these instructions.  Will watch your condition.  Will get help right away if you are not doing well or get worse. Document Released: 03/04/2007 Document Revised: 01/14/2012 Document Reviewed: 05/05/2007 [facility] Patient Information 2014 [place], [place].   ________________________________________________________________________

## 2016-10-24 ENCOUNTER — Encounter (HOSPITAL_COMMUNITY)
Admission: RE | Admit: 2016-10-24 | Discharge: 2016-10-24 | Disposition: A | Discharge status: Home / Self Care | Payer: BLUE CROSS/BLUE SHIELD | Source: Ambulatory Visit | Attending: Gynecologic Oncology | Admitting: Gynecologic Oncology

## 2016-10-24 ENCOUNTER — Encounter (HOSPITAL_COMMUNITY): Payer: Self-pay

## 2016-10-24 DIAGNOSIS — Z01812 Encounter for preprocedural laboratory examination: Secondary | ICD-10-CM | POA: Diagnosis present

## 2016-10-24 DIAGNOSIS — Z01810 Encounter for preprocedural cardiovascular examination: Secondary | ICD-10-CM | POA: Diagnosis not present

## 2016-10-24 HISTORY — DX: Sleep apnea, unspecified: G47.30

## 2016-10-24 HISTORY — DX: Gastro-esophageal reflux disease without esophagitis: K21.9

## 2016-10-24 HISTORY — DX: Prediabetes: R73.03

## 2016-10-24 HISTORY — DX: Headache, unspecified: R51.9

## 2016-10-24 HISTORY — DX: Headache: R51

## 2016-10-24 LAB — COMPREHENSIVE METABOLIC PANEL
ALK PHOS: 76 U/L (ref 38–126)
ALT: 28 U/L (ref 14–54)
ANION GAP: 9 (ref 5–15)
AST: 32 U/L (ref 15–41)
Albumin: 3.8 g/dL (ref 3.5–5.0)
BILIRUBIN TOTAL: 0.9 mg/dL (ref 0.3–1.2)
BUN: 11 mg/dL (ref 6–20)
CALCIUM: 9.1 mg/dL (ref 8.9–10.3)
CO2: 29 mmol/L (ref 22–32)
CREATININE: 0.93 mg/dL (ref 0.44–1.00)
Chloride: 102 mmol/L (ref 101–111)
GFR calc non Af Amer: >60 mL/min (ref >60)
GLUCOSE: 98 mg/dL (ref 65–99)
Potassium: 4.2 mmol/L (ref 3.5–5.1)
Sodium: 140 mmol/L (ref 135–145)
TOTAL PROTEIN: 7.6 g/dL (ref 6.5–8.1)

## 2016-10-24 LAB — CBC WITH DIFFERENTIAL/PLATELET
Basophils Absolute: 0 10*3/uL (ref 0.0–0.1)
Basophils Relative: 0 %
Eosinophils Absolute: 0.3 10*3/uL (ref 0.0–0.7)
Eosinophils Relative: 3 %
HEMATOCRIT: 40.1 % (ref 36.0–46.0)
HEMOGLOBIN: 13.8 g/dL (ref 12.0–15.0)
LYMPHS ABS: 2.8 10*3/uL (ref 0.7–4.0)
LYMPHS PCT: 23 %
MCH: 29.4 pg (ref 26.0–34.0)
MCHC: 34.4 g/dL (ref 30.0–36.0)
MCV: 85.5 fL (ref 78.0–100.0)
MONOS PCT: 5 %
Monocytes Absolute: 0.6 10*3/uL (ref 0.1–1.0)
NEUTROS PCT: 69 %
Neutro Abs: 8.2 10*3/uL — ABNORMAL HIGH (ref 1.7–7.7)
Platelets: 332 10*3/uL (ref 150–400)
RBC: 4.69 MIL/uL (ref 3.87–5.11)
RDW: 13.2 % (ref 11.5–15.5)
WBC: 11.9 10*3/uL — AB (ref 4.0–10.5)

## 2016-10-24 LAB — URINALYSIS, ROUTINE W REFLEX MICROSCOPIC
Bilirubin Urine: NEGATIVE
GLUCOSE, UA: NEGATIVE mg/dL
Ketones, ur: NEGATIVE mg/dL
Leukocytes, UA: NEGATIVE
NITRITE: NEGATIVE
PH: 7 (ref 5.0–8.0)
PROTEIN: NEGATIVE mg/dL
SPECIFIC GRAVITY, URINE: 1.008 (ref 1.005–1.030)

## 2016-10-24 LAB — PREGNANCY, URINE: Preg Test, Ur: NEGATIVE

## 2016-10-24 LAB — ABO/RH: ABO/RH(D): A POS

## 2016-10-24 NOTE — Progress Notes (Signed)
U/A done 10/24/16 faxed via EPIC to Dr Denman George and Zoila Shutter.

## 2016-10-24 NOTE — Progress Notes (Signed)
Unconfirmed EKG shows lateral ischemia.  Patient has no previous EKG per patient. Patient denies any chest pain or shortness of breath.  Sat 100% on room air.  Patient on Inderal for hypertension.  Blood pressure at preop was 152/102-104 .  Patient voices no complaints.  Anesthesia aware of above.  EKG taken to Anesthesia along with above information.  No new orders given. Anesthesia aware patient informed that she needs to see PCP regarding elevated blood pressure prior to surgery.  Patient's blood pressure needs to be better controlled prior to surgery.  Melissa Cross,NP at OB/Gyn informed of elevated blood pressure and that nurse informed patient to see PCP prior to surgery to have blood pressure evaluated.

## 2016-10-24 NOTE — Progress Notes (Signed)
Patient seen on preop appointment for upcoming surgery on 10/30/2016. Patient scored a "5" on the STOP BANG Assessment Tool for Obstructive Sleep Apnea.  This patient is considered at high risk for Obstructive Sleep Apnea using this tool .  FYI.  Thank You.

## 2016-10-24 NOTE — Progress Notes (Signed)
Temp 99.1 on preop appointment.  FYI.

## 2016-10-25 LAB — HEMOGLOBIN A1C
Hgb A1c MFr Bld: 5.8 % — ABNORMAL HIGH (ref 4.8–5.6)
MEAN PLASMA GLUCOSE: 120 mg/dL

## 2016-10-25 NOTE — Progress Notes (Signed)
Final EKG done 10/24/16- EPIC

## 2016-10-30 ENCOUNTER — Ambulatory Visit (HOSPITAL_COMMUNITY): Payer: BLUE CROSS/BLUE SHIELD | Admitting: Anesthesiology

## 2016-10-30 ENCOUNTER — Encounter (HOSPITAL_COMMUNITY)
Admission: RE | Disposition: A | Discharge status: Home / Self Care | Payer: Self-pay | Source: Ambulatory Visit | Attending: Gynecologic Oncology

## 2016-10-30 ENCOUNTER — Ambulatory Visit (HOSPITAL_COMMUNITY)
Admission: RE | Admit: 2016-10-30 | Discharge: 2016-10-31 | Disposition: A | Discharge status: Home / Self Care | Payer: BLUE CROSS/BLUE SHIELD | Source: Ambulatory Visit | Attending: Gynecologic Oncology | Admitting: Gynecologic Oncology

## 2016-10-30 ENCOUNTER — Encounter (HOSPITAL_COMMUNITY): Payer: Self-pay | Admitting: *Deleted

## 2016-10-30 DIAGNOSIS — D259 Leiomyoma of uterus, unspecified: Secondary | ICD-10-CM | POA: Insufficient documentation

## 2016-10-30 DIAGNOSIS — I10 Essential (primary) hypertension: Secondary | ICD-10-CM | POA: Diagnosis not present

## 2016-10-30 DIAGNOSIS — C541 Malignant neoplasm of endometrium: Secondary | ICD-10-CM | POA: Diagnosis present

## 2016-10-30 HISTORY — PX: LYMPH NODE BIOPSY: SHX201

## 2016-10-30 HISTORY — PX: ROBOTIC ASSISTED TOTAL HYSTERECTOMY WITH BILATERAL SALPINGO OOPHERECTOMY: SHX6086

## 2016-10-30 LAB — TYPE AND SCREEN
ABO/RH(D): A POS
ANTIBODY SCREEN: NEGATIVE

## 2016-10-30 SURGERY — HYSTERECTOMY, TOTAL, ROBOT-ASSISTED, LAPAROSCOPIC, WITH BILATERAL SALPINGO-OOPHORECTOMY
Anesthesia: General | Site: Abdomen

## 2016-10-30 MED ORDER — ROCURONIUM BROMIDE 10 MG/ML (PF) SYRINGE
PREFILLED_SYRINGE | INTRAVENOUS | Status: DC | PRN
  Administered 2016-10-30: 50 mg via INTRAVENOUS
  Administered 2016-10-30: 20 mg via INTRAVENOUS
  Administered 2016-10-30: 10 mg via INTRAVENOUS

## 2016-10-30 MED ORDER — HYDROCHLOROTHIAZIDE 12.5 MG PO CAPS
12.5000 mg | ORAL_CAPSULE | Freq: Every day | ORAL | Status: DC
  Filled 2016-10-30: qty 1

## 2016-10-30 MED ORDER — SUGAMMADEX SODIUM 500 MG/5ML IV SOLN
INTRAVENOUS | Status: AC
  Filled 2016-10-30: qty 5

## 2016-10-30 MED ORDER — PHENYLEPHRINE 40 MCG/ML (10ML) SYRINGE FOR IV PUSH (FOR BLOOD PRESSURE SUPPORT)
PREFILLED_SYRINGE | INTRAVENOUS | Status: AC
  Filled 2016-10-30: qty 10

## 2016-10-30 MED ORDER — SUGAMMADEX SODIUM 200 MG/2ML IV SOLN
INTRAVENOUS | Status: DC | PRN
  Administered 2016-10-30: 400 mg via INTRAVENOUS

## 2016-10-30 MED ORDER — CIPROFLOXACIN IN D5W 400 MG/200ML IV SOLN
400.0000 mg | Freq: Once | INTRAVENOUS | Status: AC
  Administered 2016-10-30: 400 mg via INTRAVENOUS
  Filled 2016-10-30: qty 200

## 2016-10-30 MED ORDER — STERILE WATER FOR INJECTION IJ SOLN
INTRAMUSCULAR | Status: AC
  Filled 2016-10-30: qty 10

## 2016-10-30 MED ORDER — FENTANYL CITRATE (PF) 100 MCG/2ML IJ SOLN
INTRAMUSCULAR | Status: AC
  Filled 2016-10-30: qty 2

## 2016-10-30 MED ORDER — MIDAZOLAM HCL 2 MG/2ML IJ SOLN
INTRAMUSCULAR | Status: DC | PRN
  Administered 2016-10-30: 2 mg via INTRAVENOUS

## 2016-10-30 MED ORDER — CLINDAMYCIN PHOSPHATE 900 MG/50ML IV SOLN
900.0000 mg | Freq: Once | INTRAVENOUS | Status: AC
  Administered 2016-10-30: 900 mg via INTRAVENOUS
  Filled 2016-10-30: qty 50

## 2016-10-30 MED ORDER — ROCURONIUM BROMIDE 50 MG/5ML IV SOSY
PREFILLED_SYRINGE | INTRAVENOUS | Status: AC
  Filled 2016-10-30: qty 5

## 2016-10-30 MED ORDER — DEXAMETHASONE SODIUM PHOSPHATE 10 MG/ML IJ SOLN
INTRAMUSCULAR | Status: DC | PRN
  Administered 2016-10-30: 10 mg via INTRAVENOUS

## 2016-10-30 MED ORDER — ENOXAPARIN SODIUM 40 MG/0.4ML ~~LOC~~ SOLN
40.0000 mg | SUBCUTANEOUS | Status: DC
  Administered 2016-10-31: 40 mg via SUBCUTANEOUS
  Filled 2016-10-30: qty 0.4

## 2016-10-30 MED ORDER — OLMESARTAN MEDOXOMIL-HCTZ 40-12.5 MG PO TABS: 1.0000 | ORAL_TABLET | ORAL | Status: DC

## 2016-10-30 MED ORDER — ACETAMINOPHEN 160 MG/5ML PO SOLN
960.0000 mg | Freq: Once | ORAL | Status: AC
  Administered 2016-10-30: 960 mg via ORAL

## 2016-10-30 MED ORDER — OXYMETAZOLINE HCL 0.05 % NA SOLN
2.0000 | Freq: Once | NASAL | Status: AC
Start: 2016-10-30 — End: 2016-10-30
  Administered 2016-10-30: 2 via NASAL
  Filled 2016-10-30: qty 15

## 2016-10-30 MED ORDER — KCL IN DEXTROSE-NACL 20-5-0.45 MEQ/L-%-% IV SOLN
INTRAVENOUS | Status: DC
  Administered 2016-10-30: 17:00:00 via INTRAVENOUS
  Filled 2016-10-30 (×2): qty 1000

## 2016-10-30 MED ORDER — PHENYLEPHRINE 40 MCG/ML (10ML) SYRINGE FOR IV PUSH (FOR BLOOD PRESSURE SUPPORT)
PREFILLED_SYRINGE | INTRAVENOUS | Status: DC | PRN
  Administered 2016-10-30 (×3): 80 ug via INTRAVENOUS

## 2016-10-30 MED ORDER — SUCCINYLCHOLINE CHLORIDE 200 MG/10ML IV SOSY
PREFILLED_SYRINGE | INTRAVENOUS | Status: AC
  Filled 2016-10-30: qty 10

## 2016-10-30 MED ORDER — AMLODIPINE BESYLATE 5 MG PO TABS
5.0000 mg | ORAL_TABLET | Freq: Every day | ORAL | Status: DC
  Filled 2016-10-30: qty 1

## 2016-10-30 MED ORDER — MIDAZOLAM HCL 2 MG/2ML IJ SOLN
INTRAMUSCULAR | Status: AC
  Filled 2016-10-30: qty 2

## 2016-10-30 MED ORDER — DEXAMETHASONE SODIUM PHOSPHATE 10 MG/ML IJ SOLN
INTRAMUSCULAR | Status: AC
  Filled 2016-10-30: qty 1

## 2016-10-30 MED ORDER — ONDANSETRON HCL 4 MG/2ML IJ SOLN
INTRAMUSCULAR | Status: DC | PRN
  Administered 2016-10-30: 4 mg via INTRAVENOUS

## 2016-10-30 MED ORDER — FENTANYL CITRATE (PF) 100 MCG/2ML IJ SOLN
25.0000 ug | INTRAMUSCULAR | Status: DC | PRN
  Administered 2016-10-30: 50 ug via INTRAVENOUS

## 2016-10-30 MED ORDER — ENOXAPARIN SODIUM 40 MG/0.4ML ~~LOC~~ SOLN
40.0000 mg | SUBCUTANEOUS | Status: AC
  Administered 2016-10-30: 40 mg via SUBCUTANEOUS
  Filled 2016-10-30: qty 0.4

## 2016-10-30 MED ORDER — CLINDAMYCIN PHOSPHATE 900 MG/50ML IV SOLN
INTRAVENOUS | Status: AC
Start: 2016-10-30 — End: 2016-10-30
  Filled 2016-10-30: qty 50

## 2016-10-30 MED ORDER — LIDOCAINE 2% (20 MG/ML) 5 ML SYRINGE
INTRAMUSCULAR | Status: AC
  Filled 2016-10-30: qty 5

## 2016-10-30 MED ORDER — ONDANSETRON HCL 4 MG PO TABS: 4.0000 mg | ORAL_TABLET | Freq: Four times a day (QID) | ORAL | Status: DC | PRN

## 2016-10-30 MED ORDER — FENTANYL CITRATE (PF) 100 MCG/2ML IJ SOLN
INTRAMUSCULAR | Status: DC | PRN
  Administered 2016-10-30: 50 ug via INTRAVENOUS
  Administered 2016-10-30: 150 ug via INTRAVENOUS
  Administered 2016-10-30: 50 ug via INTRAVENOUS

## 2016-10-30 MED ORDER — CEFAZOLIN SODIUM 10 G IJ SOLR: 3.0000 g | INTRAMUSCULAR | Status: DC

## 2016-10-30 MED ORDER — LIDOCAINE 2% (20 MG/ML) 5 ML SYRINGE
INTRAMUSCULAR | Status: DC | PRN
  Administered 2016-10-30: 100 mg via INTRAVENOUS

## 2016-10-30 MED ORDER — IRBESARTAN 150 MG PO TABS
300.0000 mg | ORAL_TABLET | Freq: Every day | ORAL | Status: DC
  Filled 2016-10-30: qty 2

## 2016-10-30 MED ORDER — CIPROFLOXACIN IN D5W 400 MG/200ML IV SOLN
INTRAVENOUS | Status: AC
  Filled 2016-10-30: qty 200

## 2016-10-30 MED ORDER — STERILE WATER FOR IRRIGATION IR SOLN
Status: DC | PRN
  Administered 2016-10-30: 1000 mL

## 2016-10-30 MED ORDER — SODIUM CHLORIDE 0.9 % IR SOLN
Status: DC | PRN
  Administered 2016-10-30: 1000 mL

## 2016-10-30 MED ORDER — ONDANSETRON HCL 4 MG/2ML IJ SOLN: 4.0000 mg | Freq: Four times a day (QID) | INTRAMUSCULAR | Status: DC | PRN

## 2016-10-30 MED ORDER — HYDROMORPHONE HCL 2 MG PO TABS: 2.0000 mg | ORAL_TABLET | ORAL | Status: DC | PRN

## 2016-10-30 MED ORDER — LIDOCAINE HCL 2 % EX GEL
CUTANEOUS | Status: AC
  Filled 2016-10-30: qty 5

## 2016-10-30 MED ORDER — ACETAMINOPHEN 160 MG/5ML PO SOLN
ORAL | Status: AC
Start: 2016-10-30 — End: 2016-10-30
  Filled 2016-10-30: qty 40.6

## 2016-10-30 MED ORDER — ONDANSETRON HCL 4 MG/2ML IJ SOLN
INTRAMUSCULAR | Status: AC
  Filled 2016-10-30: qty 2

## 2016-10-30 MED ORDER — LACTATED RINGERS IV SOLN
INTRAVENOUS | Status: DC | PRN
  Administered 2016-10-30 (×2): via INTRAVENOUS

## 2016-10-30 MED ORDER — TRAMADOL HCL 50 MG PO TABS
100.0000 mg | ORAL_TABLET | Freq: Four times a day (QID) | ORAL | Status: DC | PRN
  Administered 2016-10-30 – 2016-10-31 (×2): 100 mg via ORAL
  Filled 2016-10-30 (×2): qty 2

## 2016-10-30 MED ORDER — IBUPROFEN 400 MG PO TABS
800.0000 mg | ORAL_TABLET | Freq: Three times a day (TID) | ORAL | Status: DC | PRN
  Administered 2016-10-30: 800 mg via ORAL
  Filled 2016-10-30: qty 2

## 2016-10-30 MED ORDER — HYDROMORPHONE HCL 1 MG/ML IJ SOLN: 0.2000 mg | INTRAMUSCULAR | Status: DC | PRN

## 2016-10-30 MED ORDER — OXYMETAZOLINE HCL 0.05 % NA SOLN
NASAL | Status: AC
  Filled 2016-10-30: qty 15

## 2016-10-30 MED ORDER — GABAPENTIN 300 MG PO CAPS
600.0000 mg | ORAL_CAPSULE | Freq: Every day | ORAL | Status: AC
  Administered 2016-10-30: 600 mg via ORAL
  Filled 2016-10-30: qty 2

## 2016-10-30 MED ORDER — LACTATED RINGERS IV SOLN: INTRAVENOUS | Status: DC

## 2016-10-30 MED ORDER — SUCCINYLCHOLINE CHLORIDE 200 MG/10ML IV SOSY
PREFILLED_SYRINGE | INTRAVENOUS | Status: DC | PRN
  Administered 2016-10-30: 200 mg via INTRAVENOUS

## 2016-10-30 MED ORDER — PROPOFOL 10 MG/ML IV BOLUS
INTRAVENOUS | Status: AC
  Filled 2016-10-30: qty 20

## 2016-10-30 MED ORDER — FENTANYL CITRATE (PF) 250 MCG/5ML IJ SOLN
INTRAMUSCULAR | Status: AC
  Filled 2016-10-30: qty 5

## 2016-10-30 MED ORDER — PROPOFOL 10 MG/ML IV BOLUS
INTRAVENOUS | Status: DC | PRN
  Administered 2016-10-30: 200 mg via INTRAVENOUS

## 2016-10-30 SURGICAL SUPPLY — 59 items
APPLICATOR SURGIFLO ENDO (HEMOSTASIS) IMPLANT
BAG LAPAROSCOPIC 12 15 PORT 16 (BASKET) IMPLANT
BAG RETRIEVAL 12/15 (BASKET)
CHLORAPREP W/TINT 26ML (MISCELLANEOUS) ×6 IMPLANT
COVER SURGICAL LIGHT HANDLE (MISCELLANEOUS) ×3 IMPLANT
COVER TIP SHEARS 8 DVNC (MISCELLANEOUS) ×2 IMPLANT
COVER TIP SHEARS 8MM DA VINCI (MISCELLANEOUS) ×1
DERMABOND ADVANCED (GAUZE/BANDAGES/DRESSINGS) ×1
DERMABOND ADVANCED .7 DNX12 (GAUZE/BANDAGES/DRESSINGS) ×2 IMPLANT
DRAPE ARM DVNC X/XI (DISPOSABLE) ×8 IMPLANT
DRAPE COLUMN DVNC XI (DISPOSABLE) ×2 IMPLANT
DRAPE DA VINCI XI ARM (DISPOSABLE) ×4
DRAPE DA VINCI XI COLUMN (DISPOSABLE) ×1
DRAPE SHEET LG 3/4 BI-LAMINATE (DRAPES) ×6 IMPLANT
DRAPE SURG IRRIG POUCH 19X23 (DRAPES) ×3 IMPLANT
ELECT REM PT RETURN 15FT ADLT (MISCELLANEOUS) ×3 IMPLANT
GLOVE BIO SURGEON STRL SZ 6 (GLOVE) ×12 IMPLANT
GLOVE BIO SURGEON STRL SZ 6.5 (GLOVE) ×6 IMPLANT
GOWN STRL REUS W/ TWL LRG LVL3 (GOWN DISPOSABLE) ×4 IMPLANT
GOWN STRL REUS W/TWL LRG LVL3 (GOWN DISPOSABLE) ×2
HOLDER FOLEY CATH W/STRAP (MISCELLANEOUS) ×3 IMPLANT
IRRIG SUCT STRYKERFLOW 2 WTIP (MISCELLANEOUS) ×3
IRRIGATION SUCT STRKRFLW 2 WTP (MISCELLANEOUS) ×2 IMPLANT
KIT BASIN OR (CUSTOM PROCEDURE TRAY) ×3 IMPLANT
KIT PROCEDURE DA VINCI SI (MISCELLANEOUS)
KIT PROCEDURE DVNC SI (MISCELLANEOUS) IMPLANT
MANIPULATOR UTERINE 4.5 ZUMI (MISCELLANEOUS) ×3 IMPLANT
MARKER SKIN DUAL TIP RULER LAB (MISCELLANEOUS) ×3 IMPLANT
NDL SAFETY ECLIPSE 18X1.5 (NEEDLE) ×2 IMPLANT
NEEDLE HYPO 18GX1.5 SHARP (NEEDLE) ×1
NEEDLE SPNL 18GX3.5 QUINCKE PK (NEEDLE) ×3 IMPLANT
OBTURATOR OPTICAL STANDARD 8MM (TROCAR) ×1
OBTURATOR OPTICAL STND 8 DVNC (TROCAR) ×2
OBTURATOR OPTICALSTD 8 DVNC (TROCAR) ×2 IMPLANT
OCCLUDER COLPOPNEUMO (BALLOONS) ×3 IMPLANT
PAD POSITIONING PINK XL (MISCELLANEOUS) ×3 IMPLANT
PORT ACCESS TROCAR AIRSEAL 12 (TROCAR) ×2 IMPLANT
PORT ACCESS TROCAR AIRSEAL 5M (TROCAR) ×1
POUCH SPECIMEN RETRIEVAL 10MM (ENDOMECHANICALS) IMPLANT
SEAL CANN UNIV 5-8 DVNC XI (MISCELLANEOUS) ×8 IMPLANT
SEAL XI 5MM-8MM UNIVERSAL (MISCELLANEOUS) ×4
SET TRI-LUMEN FLTR TB AIRSEAL (TUBING) ×3 IMPLANT
SHEET LAVH (DRAPES) ×3 IMPLANT
SOLUTION ELECTROLUBE (MISCELLANEOUS) ×3 IMPLANT
SURGIFLO W/THROMBIN 8M KIT (HEMOSTASIS) IMPLANT
SUT MNCRL AB 4-0 PS2 18 (SUTURE) ×6 IMPLANT
SUT VIC AB 0 CT1 27 (SUTURE) ×1
SUT VIC AB 0 CT1 27XBRD ANTBC (SUTURE) ×2 IMPLANT
SYR 10ML LL (SYRINGE) ×3 IMPLANT
SYR 50ML LL SCALE MARK (SYRINGE) ×3 IMPLANT
TOWEL OR 17X26 10 PK STRL BLUE (TOWEL DISPOSABLE) ×6 IMPLANT
TOWEL OR NON WOVEN STRL DISP B (DISPOSABLE) ×3 IMPLANT
TRAP SPECIMEN MUCOUS 40CC (MISCELLANEOUS) IMPLANT
TRAY FOLEY W/METER SILVER 16FR (SET/KITS/TRAYS/PACK) ×3 IMPLANT
TRAY LAPAROSCOPIC (CUSTOM PROCEDURE TRAY) ×3 IMPLANT
TROCAR BLADELESS OPT 5 100 (ENDOMECHANICALS) ×3 IMPLANT
UNDERPAD 30X30 (UNDERPADS AND DIAPERS) ×3 IMPLANT
UNDERPAD 30X30 INCONTINENT (UNDERPADS AND DIAPERS) ×3 IMPLANT
WATER STERILE IRR 1500ML POUR (IV SOLUTION) ×6 IMPLANT

## 2016-10-30 NOTE — Anesthesia Postprocedure Evaluation (Signed)
Anesthesia Post Note  Patient: SANAM DWIGGINS  Procedure(s) Performed: Procedure(s) (LRB): XI ROBOTIC ASSISTED TOTAL LAPAROSCOPIC  HYSTERECTOMY WITH BILATERAL SALPINGO OOPHORECTOMY (Bilateral) SENTINAL LYMPH NODE BIOPSY (N/A)  Patient location during evaluation: PACU Anesthesia Type: General Level of consciousness: awake and alert and patient cooperative Pain management: pain level controlled Vital Signs Assessment: post-procedure vital signs reviewed and stable Respiratory status: spontaneous breathing and respiratory function stable Cardiovascular status: stable Anesthetic complications: no       Last Vitals:  Vitals:   10/30/16 1600 10/30/16 1724  BP: 130/87 133/82  Pulse: 98 (!) 101  Resp: 16 16  Temp: 37.1 C 37.1 C    Last Pain:  Vitals:   10/30/16 1820  TempSrc:   PainSc: Asleep                 Schylar Allard S

## 2016-10-30 NOTE — Anesthesia Preprocedure Evaluation (Signed)
Anesthesia Evaluation  Patient identified by MRN, date of birth, ID band Patient awake    Reviewed: Allergy & Precautions, H&P , Patient's Chart, lab work & pertinent test results, reviewed documented beta blocker date and time   Airway Mallampati: III  TM Distance: >3 FB Neck ROM: full    Dental no notable dental hx.    Pulmonary sleep apnea ,    Pulmonary exam normal breath sounds clear to auscultation       Cardiovascular hypertension, On Medications  Rhythm:regular Rate:Normal     Neuro/Psych    GI/Hepatic   Endo/Other    Renal/GU      Musculoskeletal   Abdominal   Peds  Hematology   Anesthesia Other Findings Hypertension; recently higher, saw LMD for improved Rx. Better today........ No Sx of CAD Sleep apnea ( likely )   no official diagnosis Pre-diabetes    Headache  hx of migraines GERD (gastroesophageal reflux disease)         Reproductive/Obstetrics                             Anesthesia Physical Anesthesia Plan  ASA: III  Anesthesia Plan: General   Post-op Pain Management:    Induction: Intravenous  Airway Management Planned: Oral ETT and Video Laryngoscope Planned  Additional Equipment:   Intra-op Plan:   Post-operative Plan: Extubation in OR  Informed Consent: I have reviewed the patients History and Physical, chart, labs and discussed the procedure including the risks, benefits and alternatives for the proposed anesthesia with the patient or authorized representative who has indicated his/her understanding and acceptance.   Dental Advisory Given and Dental advisory given  Plan Discussed with: CRNA and Surgeon  Anesthesia Plan Comments: (  Discussed general anesthesia, including possible nausea, instrumentation of airway, sore throat,pulmonary aspiration, etc. I asked if the were any outstanding questions, or  concerns before we proceeded.)         Anesthesia Quick Evaluation
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
## 2016-10-30 NOTE — Anesthesia Procedure Notes (Addendum)
Procedure Name: Intubation Date/Time: 10/30/2016 10:20 AM Performed by: Lyndle Herrlich Patient Re-evaluated:Patient Re-evaluated prior to inductionOxygen Delivery Method: Circle system utilized Preoxygenation: Pre-oxygenation with 100% oxygen Intubation Type: IV induction Ventilation: Mask ventilation without difficulty and Oral airway inserted - appropriate to patient size Laryngoscope Size: Glidescope Grade View: Grade II Tube type: Oral Tube size: 7.5 mm Number of attempts: 1 Airway Equipment and Method: Video-laryngoscopy Placement Confirmation: ETT inserted through vocal cords under direct vision,  positive ETCO2 and breath sounds checked- equal and bilateral Secured at: 23 cm Tube secured with: Tape Dental Injury: Teeth and Oropharynx as per pre-operative assessment

## 2016-10-30 NOTE — Op Note (Signed)
OPERATIVE NOTE 10/30/16  Surgeon: Donaciano Eva   Assistants: Dr Lahoma Crocker (an MD assistant was necessary for tissue manipulation, management of robotic instrumentation, retraction and positioning due to the complexity of the case and hospital policies).   Anesthesia: General endotracheal anesthesia  ASA Class: 3   Pre-operative Diagnosis: endometrial cancer stage 0  Post-operative Diagnosis: same  Operation: Robotic-assisted laparoscopic total hysterectomy with bilateral salpingoophorectomy with sentinel lymph node biopsy  Surgeon: Donaciano Eva  Assistant Surgeon: Lahoma Crocker MD  Anesthesia: GET  Urine Output: 200  Operative Findings:  : 8cm uterus, grossly normal, normal ovaries bilaterally. No suspicious nodes. Significant intra and retroperitoneal adiposity making visualizaton difficult.  Estimated Blood Loss:  Minimal    (<20)  Total IV Fluids: 500 ml         Specimens: uterus, cervix, bilateral tubes and ovaries, right obturator SLN, riight external iliac SLN, lett obturator SLN, left external iliac SLN         Complications:  None; patient tolerated the procedure well.         Disposition: PACU - hemodynamically stable.  Procedure Details  The patient was seen in the Holding Room. The risks, benefits, complications, treatment options, and expected outcomes were discussed with the patient.  The patient concurred with the proposed plan, giving informed consent.  The site of surgery properly noted/marked. The patient was identified as Donna Melendez and the procedure verified as a Robotic-assisted hysterectomy with bilateral salpingo oophorectomy with SLN biopsy. A Time Out was held and the above information confirmed.  After induction of anesthesia, the patient was draped and prepped in the usual sterile manner. Pt was placed in supine position after anesthesia and draped and prepped in the usual sterile manner. The abdominal drape was  placed after the CholoraPrep had been allowed to dry for 3 minutes.  Her arms were tucked to her side with all appropriate precautions.  The shoulders were stabilized with padded shoulder blocks applied to the acromium processes.  The patient was placed in the semi-lithotomy position in [place].  The perineum was prepped with Betadine. The patient was then prepped. Foley catheter was placed.  A sterile speculum was placed in the vagina.  The cervix was grasped with a single-tooth tenaculum and dilated with Kennon Rounds dilators. 1mg  total of ICG was injected into the cervical stroma at 2 and 9 o'clock at a 24mm depth (concentration 0..5mg /ml). The ZUMI uterine manipulator with a medium colpotomizer ring was placed without difficulty.  A pneum occluder balloon was placed over the manipulator.  OG tube placement was confirmed and to suction.   Next, a 5 mm skin incision was made 1 cm below the subcostal margin in the midclavicular line.  The 5 mm Optiview port and scope was used for direct entry.  Opening pressure was under 10 mm CO2.  The abdomen was insufflated and the findings were noted as above.   At this point and all points during the procedure, the patient's intra-abdominal pressure did not exceed 15 mmHg. Next, a 10 mm skin incision was made in the umbilicus and a right and left port was placed about 10 cm lateral to the robot port on the right and left side.  A fourth arm was placed in the left lower quadrant 2 cm above and superior and medial to the anterior superior iliac spine.  All ports were placed under direct visualization.  The patient was placed in steep Trendelenburg.  Bowel was folded away into the  upper abdomen.  The robot was docked in the normal manner.  The right and left peritoneum were opened parallel to the IP ligament to open the retroperitoneal spaces bilaterally. The SLN mapping was performed in bilateral pelvic basins. The para rectal and paravesical spaces were opened up. Lymphatic  channels were identified travelling to the following visualized sentinel lymph node's: left and right obturator SLN, left and right external iliac SLN. These SLN's were separated from their surrounding lymphatic tissue, removed and sent for permanent pathology.  The hysterectomy was started after the round ligament on the right side was incised and the retroperitoneum was entered and the pararectal space was developed.  The ureter was noted to be on the medial leaf of the broad ligament.  The peritoneum above the ureter was incised and stretched and the infundibulopelvic ligament was skeletonized, cauterized and cut.  The posterior peritoneum was taken down to the level of the KOH ring.  The anterior peritoneum was also taken down.  The bladder flap was created to the level of the KOH ring.  The uterine artery on the right side was skeletonized, cauterized and cut in the normal manner.  A similar procedure was performed on the left.  The colpotomy was made and the uterus, cervix, bilateral ovaries and tubes were amputated and delivered through the vagina.  Pedicles were inspected and excellent hemostasis was achieved.    The colpotomy at the vaginal cuff was closed with Vicryl on a CT1 needle in a running manner.  Irrigation was used and excellent hemostasis was achieved.  At this point in the procedure was completed.  Robotic instruments were removed under direct visulaization.  The robot was undocked. The 10 mm ports were closed with Vicryl on a UR-5 needle and the fascia was closed with 0 Vicryl on a UR-5 needle.  The skin was closed with 4-0 Vicryl in a subcuticular manner.  Dermabond was applied.  Sponge, lap and needle counts correct x 2.  The patient was taken to the recovery room in stable condition.  The vagina was swabbed with  minimal bleeding noted.   All instrument and needle counts were correct x  3.   The patient was transferred to the recovery room in a stable condition.  Donaciano Eva, MD

## 2016-10-30 NOTE — Interval H&P Note (Signed)
History and Physical Interval Note:  10/30/2016 9:20 AM  Donna Melendez  has presented today for surgery, with the diagnosis of UTERINE CANCER  The various methods of treatment have been discussed with the patient and family. After consideration of risks, benefits and other options for treatment, the patient has consented to  Procedure(s): XI ROBOTIC [place] (Bilateral) SENTINAL LYMPH NODE BIOPSY (N/A) as a surgical intervention .  The patient's history has been reviewed, patient examined, no change in status, stable for surgery.  I have reviewed the patient's chart and labs.  Questions were answered to the patient's satisfaction.     Donaciano Eva

## 2016-10-30 NOTE — Transfer of Care (Signed)
Immediate Anesthesia Transfer of Care Note  Patient: Donna Melendez  Procedure(s) Performed: Procedure(s): XI ROBOTIC ASSISTED TOTAL LAPAROSCOPIC  HYSTERECTOMY WITH BILATERAL SALPINGO OOPHORECTOMY (Bilateral) SENTINAL LYMPH NODE BIOPSY (N/A)  Patient Location: PACU  Anesthesia Type:General  Level of Consciousness: sedated  Airway & Oxygen Therapy: Patient Spontanous Breathing and Patient connected to face mask oxygen  Post-op Assessment: Report given to RN and Post -op Vital signs reviewed and stable  Post vital signs: Reviewed and stable  Last Vitals:  Vitals:   10/30/16 0802  BP: (!) 153/93  Pulse: 100  Resp: 18  Temp: 36.8 C    Last Pain:  Vitals:   10/30/16 0802  TempSrc: Oral      Patients Stated Pain Goal: 4 ([id number])  Complications: No apparent anesthesia complications

## 2016-10-30 NOTE — Progress Notes (Signed)
Spoke to pharmacy and they will send somone to do med rec at [facility]..

## 2016-10-30 NOTE — Discharge Instructions (Signed)
10/30/2016  Return to work: 2-4  Activity: 1. Be up and out of the bed during the day.  Take a nap if needed.  You may walk up steps but be careful and use the hand rail.  Stair climbing will tire you more than you think, you may need to stop part way and rest.   2. No lifting or straining for 6 weeks.  3. No driving for 1 weeks.  Do Not drive if you are taking narcotic pain medicine.  4. Shower daily.  Use soap and water on your incision and pat dry; don't rub.   5. No sexual activity and nothing in the vagina for 8 weeks.  Diet: 1. Low sodium Heart Healthy Diet is recommended.  2. It is safe to use a laxative if you have difficulty moving your bowels.   Wound Care: 1. Keep clean and dry.  Shower daily.  Reasons to call the Doctor:   Fever - Oral temperature greater than 100.4 degrees Fahrenheit  Foul-smelling vaginal discharge  Difficulty urinating  Nausea and vomiting  Increased pain at the site of the incision that is unrelieved with pain medicine.  Difficulty breathing with or without chest pain  New calf pain especially if only on one side  Sudden, continuing increased vaginal bleeding with or without clots.   Follow-up: 1. See Everitt Amber in 3 weeks.  Contacts: For questions or concerns you should contact:  Dr. Everitt Amber at [phone]  or at [place]

## 2016-10-31 ENCOUNTER — Other Ambulatory Visit: Payer: Self-pay | Admitting: Gynecologic Oncology

## 2016-10-31 DIAGNOSIS — G43009 Migraine without aura, not intractable, without status migrainosus: Secondary | ICD-10-CM | POA: Insufficient documentation

## 2016-10-31 DIAGNOSIS — R0683 Snoring: Secondary | ICD-10-CM | POA: Insufficient documentation

## 2016-10-31 DIAGNOSIS — C541 Malignant neoplasm of endometrium: Secondary | ICD-10-CM | POA: Diagnosis not present

## 2016-10-31 DIAGNOSIS — E8940 Asymptomatic postprocedural ovarian failure: Secondary | ICD-10-CM

## 2016-10-31 DIAGNOSIS — R400 Somnolence: Secondary | ICD-10-CM | POA: Insufficient documentation

## 2016-10-31 LAB — CBC
HCT: 41.6 % (ref 36.0–46.0)
Hemoglobin: 13.6 g/dL (ref 12.0–15.0)
MCH: 28.7 pg (ref 26.0–34.0)
MCHC: 32.7 g/dL (ref 30.0–36.0)
MCV: 87.8 fL (ref 78.0–100.0)
Platelets: 345 K/uL (ref 150–400)
RBC: 4.74 MIL/uL (ref 3.87–5.11)
RDW: 13.4 % (ref 11.5–15.5)
WBC: 21.6 K/uL — ABNORMAL HIGH (ref 4.0–10.5)

## 2016-10-31 LAB — BASIC METABOLIC PANEL WITH GFR
Anion gap: 5 (ref 5–15)
BUN: 13 mg/dL (ref 6–20)
CO2: 26 mmol/L (ref 22–32)
Calcium: 8.9 mg/dL (ref 8.9–10.3)
Chloride: 103 mmol/L (ref 101–111)
Creatinine, Ser: 1.02 mg/dL — ABNORMAL HIGH (ref 0.44–1.00)
GFR calc Af Amer: >60 mL/min
GFR calc non Af Amer: >60 mL/min
Glucose, Bld: 130 mg/dL — ABNORMAL HIGH (ref 65–99)
Potassium: 5.1 mmol/L (ref 3.5–5.1)
Sodium: 134 mmol/L — ABNORMAL LOW (ref 135–145)

## 2016-10-31 MED ORDER — HYDROMORPHONE HCL 2 MG PO TABS: 2.0000 mg | ORAL_TABLET | ORAL | 0 refills | Status: DC | PRN

## 2016-10-31 MED ORDER — SENNA 8.6 MG PO TABS: 1.0000 | ORAL_TABLET | Freq: Every evening | ORAL | 0 refills | Status: DC | PRN

## 2016-10-31 MED ORDER — ESTRADIOL 0.5 MG PO TABS: 0.5000 mg | ORAL_TABLET | Freq: Every day | ORAL | 6 refills | Status: DC

## 2016-10-31 MED ORDER — ESTROGENS CONJUGATED 0.45 MG PO TABS: 0.4500 mg | ORAL_TABLET | Freq: Every day | ORAL | 11 refills | Status: DC

## 2016-10-31 NOTE — Discharge Summary (Signed)
Physician Discharge Summary  Patient ID: Donna Melendez MRN: [id number] DOB/AGE: Mar 10, 1971 45 y.o.  Admit date: 10/30/2016 Discharge date: 10/31/2016  Admission Diagnoses: Endometrial cancer [facility])  Discharge Diagnoses:  Principal Problem:   Endometrial cancer [facility])   Discharged Condition: good  Hospital Course:  1/ patient was admitted on 10/30/16 for robotic assisted total hysterectomy, BSO, SLN biopsy for stage 0 endometrial cancer/CAH 2/ surgery was uncomplicated, pathology pending 3/ postoperatively she did well with no concerns or complaints. 4/ she was discharged to home doing well on POD1 meeting discharge criteria  Consults: None  Significant Diagnostic Studies: labs:  CBC    Component Value Date/Time   WBC 21.6 (H) 10/31/2016 0440   RBC 4.74 10/31/2016 0440   HGB 13.6 10/31/2016 0440   HGB 14.5 07/17/2016 1601   HCT 41.6 10/31/2016 0440   PLT 345 10/31/2016 0440   MCV 87.8 10/31/2016 0440   MCH 28.7 10/31/2016 0440   MCHC 32.7 10/31/2016 0440   RDW 13.4 10/31/2016 0440   LYMPHSABS 2.8 10/24/2016 0929   MONOABS 0.6 10/24/2016 0929   EOSABS 0.3 10/24/2016 0929   BASOSABS 0.0 10/24/2016 0929    Treatments: surgery: see above  Discharge Exam: Blood pressure (!) 110/54, pulse 87, temperature 98.1 F (36.7 C), temperature source Oral, resp. rate 20, height 5\' 2"  (1.575 m), weight 297 lb (134.7 kg), last menstrual period 10/24/2016, SpO2 96 %. General appearance: alert and cooperative Cardio: regular rate and rhythm, S1, S2 normal, no murmur, click, rub or gallop GI: soft, non-tender; bowel sounds normal; no masses,  no organomegaly Incision/Wound: x 5 clean/dry/intact  Disposition: Final discharge disposition not confirmed  Discharge Instructions    (HEART FAILURE PATIENTS) Call MD:  Anytime you have any of the following symptoms: 1) 3 pound weight gain in 24 hours or 5 pounds in 1 week 2) shortness of breath, with or without a dry hacking cough 3)  swelling in the hands, feet or stomach 4) if you have to sleep on extra pillows at night in order to breathe.    Complete by:  As directed    Call MD for:  difficulty breathing, headache or visual disturbances    Complete by:  As directed    Call MD for:  extreme fatigue    Complete by:  As directed    Call MD for:  hives    Complete by:  As directed    Call MD for:  persistant dizziness or light-headedness    Complete by:  As directed    Call MD for:  persistant nausea and vomiting    Complete by:  As directed    Call MD for:  redness, tenderness, or signs of infection (pain, swelling, redness, odor or green/yellow discharge around incision site)    Complete by:  As directed    Call MD for:  severe uncontrolled pain    Complete by:  As directed    Call MD for:  temperature >100.4    Complete by:  As directed    Diet - low sodium heart healthy    Complete by:  As directed    Diet general    Complete by:  As directed    Driving Restrictions    Complete by:  As directed    No driving for 7 days or until off narcotic pain medication   Increase activity slowly    Complete by:  As directed    Remove dressing in 24 hours    Complete by:  As directed    Sexual Activity Restrictions    Complete by:  As directed    No intercourse for 6 weeks     Allergies as of 10/31/2016      Reactions   Codeine Nausea And Vomiting   Hydrocodone Nausea And Vomiting   Keflex [cephalexin] Itching      Medication List    TAKE these medications   amLODipine 5 MG tablet Commonly known as:  NORVASC Take 5 mg by mouth every morning.   cyclobenzaprine 5 MG tablet Commonly known as:  FLEXERIL Take 5-10 mg by mouth 3 (three) times daily as needed.   estrogens (conjugated) 0.45 MG tablet Commonly known as:  PREMARIN Take 1 tablet (0.45 mg total) by mouth daily. Take daily for 21 days then do not take for 7 days.   HYDROmorphone 2 MG tablet Commonly known as:  DILAUDID Take 1 tablet (2 mg total)  by mouth every 4 (four) hours as needed for moderate pain.   ibuprofen 200 MG tablet Commonly known as:  ADVIL,MOTRIN Take 600 mg by mouth every 8 (eight) hours as needed (for pain).   olmesartan-hydrochlorothiazide 40-12.5 MG tablet Commonly known as:  BENICAR HCT Take 1 tablet by mouth every morning.   senna 8.6 MG Tabs tablet Commonly known as:  SENOKOT Take 1 tablet (8.6 mg total) by mouth at bedtime as needed for mild constipation.   Vitamin D (Ergocalciferol) 50000 units Caps capsule Commonly known as:  DRISDOL Take 1 capsule (50,000 Units total) by mouth every 7 (seven) days.        Signed: Donaciano Eva 10/31/2016, 8:52 AM

## 2016-10-31 NOTE — Progress Notes (Signed)
Discharge instructions discussed with patient and family.  Verbalized agreement and understanding.  Prescription given to family.

## 2016-11-01 ENCOUNTER — Telehealth: Payer: Self-pay | Admitting: Gynecologic Oncology

## 2016-11-01 ENCOUNTER — Telehealth: Payer: Self-pay

## 2016-11-01 NOTE — Telephone Encounter (Signed)
Pt aware script called in for Estrace tablets, and pt inquired about FMLA papers. RN told pt that FMLA papers have not arrived to our office, confirmed our fax #. Pt was to call her employer today.

## 2016-11-01 NOTE — Telephone Encounter (Signed)
Post op telephone call to check patient status.  Patient describes expected post operative status.  Adequate PO intake reported.  Bowels and bladder functioning without difficulty.  Pain minimal.  Reportable signs and symptoms reviewed.  Follow up appt previously arranged.  Final path discussed.  Patient stating she is feeling "kinda low today" but she picked up her hormones and plans to start them today.  Advised to call our office for any questions or concerns and to follow up as scheduled or sooner if needed.
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
## 2016-11-26 ENCOUNTER — Encounter: Payer: Self-pay | Admitting: Gynecologic Oncology

## 2016-11-26 ENCOUNTER — Ambulatory Visit: Payer: BLUE CROSS/BLUE SHIELD | Attending: Gynecologic Oncology | Admitting: Gynecologic Oncology

## 2016-11-26 VITALS — BP 154/93 | HR 103 | Temp 98.2°F | Resp 18 | Ht 62.0 in | Wt 294.8 lb

## 2016-11-26 DIAGNOSIS — Z90710 Acquired absence of both cervix and uterus: Secondary | ICD-10-CM

## 2016-11-26 DIAGNOSIS — C541 Malignant neoplasm of endometrium: Secondary | ICD-10-CM | POA: Diagnosis not present

## 2016-11-26 DIAGNOSIS — Z79899 Other long term (current) drug therapy: Secondary | ICD-10-CM | POA: Diagnosis not present

## 2016-11-26 DIAGNOSIS — Z5189 Encounter for other specified aftercare: Secondary | ICD-10-CM | POA: Insufficient documentation

## 2016-11-26 NOTE — Patient Instructions (Signed)
Plan to follow up with Dr. Sabra Heck in six months and Dr. Denman George in one year.  Please call [phone] to schedule your appt after you see Dr. Sabra Heck.  Please call for any questions or concerns.

## 2016-11-27 ENCOUNTER — Other Ambulatory Visit: Payer: Self-pay | Admitting: Gynecologic Oncology

## 2016-11-27 DIAGNOSIS — E8940 Asymptomatic postprocedural ovarian failure: Secondary | ICD-10-CM

## 2016-11-27 MED ORDER — ESTRADIOL 0.5 MG PO TABS: 0.5000 mg | ORAL_TABLET | Freq: Every day | ORAL | 6 refills | Status: DC

## 2016-11-29 ENCOUNTER — Telehealth: Payer: Self-pay | Admitting: *Deleted

## 2016-11-29 NOTE — Telephone Encounter (Signed)
Follow up Vit D needed.  Originally was due in December, but was delayed due to upcoming surgery.  Vitamin D has not been checked since 07/2016.  Phone call to patient to schedule follow up lab appointment.  Patient states she only took "a couple of those pills" when she got them in September and has not taken any since.  Patient states she does not know if she has the remaining capsules anymore.  Patient also states she is not going to schedule any appointments for labs and does not want to have it rechecked.  Advised patient I would let Edman Circle, FNP know she is not taking Vit D supplement and does not desire recheck at this time.  Please advise follow up.

## 2016-11-29 NOTE — Telephone Encounter (Signed)
-----   Message from Graylon Good, [place] sent at 07/19/2016 10:30 AM EDT ----- Regarding: repeat labs Needs vit d recheck 3 months from 07/19/16. meds went to mail order pharm.

## 2016-11-30 NOTE — Telephone Encounter (Signed)
OK if she decides not to come in for labs since she has not been on RX Vit D.  However she would feel so much better if she did take Vit D even if OTC at 2000 IU daily.  We can check Vit D when she comes in at 6 month visit. ( Per note from surgeon we are to see her in 6 months).

## 2016-12-06 NOTE — Telephone Encounter (Signed)
Patient states she will take 2000 IU of OTC Vit D daily and is agreeable to recheck at 6 month appointment with Dr. Sabra Heck.  Routing to provider for final review.  Closing encounter.

## 2017-04-19 ENCOUNTER — Telehealth: Payer: Self-pay | Admitting: Obstetrics & Gynecology

## 2017-04-19 NOTE — Telephone Encounter (Signed)
Patient cancelled her 60mo fu hyst/endometrial CA for Tuesday and does not wish to reschedule at this time.

## 2017-04-19 NOTE — Telephone Encounter (Signed)
Spoke with patient. Patient states she is not at home to look at schedule to reschedule at the moment. Patient states she has tried all of these years not to have children and now suddenly has custody of her 46 year old nephew who requires most of her attention and   appointments to speech therapy. Patient states she will return call to office on Monday to schedule, knows for sure she is unavailable next week. Advised patient RN would assist with finding a time that works for her and will f/u for scheduling if no return call. Patient verbalizes understanding and is agreeable.    Routing to Dr. Lestine Box

## 2017-04-21 ENCOUNTER — Encounter: Payer: Self-pay | Admitting: Obstetrics & Gynecology

## 2017-04-21 NOTE — Telephone Encounter (Signed)
Letter written.  Ok to send to pt and close encounter.  Thanks.

## 2017-04-22 NOTE — Telephone Encounter (Signed)
Letter to Dr. Sabra Heck for signature.

## 2017-04-22 NOTE — Telephone Encounter (Signed)
Signed letter to Alinda Sierras to be mailed certified. Will close encounter.

## 2017-04-23 ENCOUNTER — Ambulatory Visit: Payer: BLUE CROSS/BLUE SHIELD | Admitting: Obstetrics & Gynecology

## 2017-08-27 ENCOUNTER — Other Ambulatory Visit: Payer: Self-pay | Admitting: Family Medicine

## 2017-08-27 ENCOUNTER — Ambulatory Visit
Admission: RE | Admit: 2017-08-27 | Discharge: 2017-08-27 | Disposition: A | Discharge status: Home / Self Care | Payer: BLUE CROSS/BLUE SHIELD | Source: Ambulatory Visit | Attending: Family Medicine | Admitting: Family Medicine

## 2017-08-27 DIAGNOSIS — R05 Cough: Secondary | ICD-10-CM

## 2017-08-27 DIAGNOSIS — R059 Cough, unspecified: Secondary | ICD-10-CM

## 2018-01-20 ENCOUNTER — Other Ambulatory Visit: Payer: Self-pay | Admitting: Gynecologic Oncology

## 2018-01-20 DIAGNOSIS — E8940 Asymptomatic postprocedural ovarian failure: Secondary | ICD-10-CM

## 2018-01-21 NOTE — Telephone Encounter (Signed)
Appt needs to be made with Dr. Denman George before any further refills

## 2018-04-21 ENCOUNTER — Other Ambulatory Visit: Payer: Self-pay | Admitting: Gynecologic Oncology

## 2018-04-21 DIAGNOSIS — E8940 Asymptomatic postprocedural ovarian failure: Secondary | ICD-10-CM

## 2020-03-03 ENCOUNTER — Other Ambulatory Visit: Payer: Self-pay | Admitting: Family Medicine

## 2020-03-03 DIAGNOSIS — R1011 Right upper quadrant pain: Secondary | ICD-10-CM

## 2020-03-07 ENCOUNTER — Other Ambulatory Visit: Payer: BLUE CROSS/BLUE SHIELD

## 2020-03-08 ENCOUNTER — Ambulatory Visit
Admission: RE | Admit: 2020-03-08 | Discharge: 2020-03-08 | Disposition: A | Discharge status: Home / Self Care | Payer: BLUE CROSS/BLUE SHIELD | Source: Ambulatory Visit | Attending: Family Medicine | Admitting: Family Medicine

## 2020-03-08 DIAGNOSIS — R1011 Right upper quadrant pain: Secondary | ICD-10-CM

## 2021-08-25 IMAGING — US US ABDOMEN LIMITED
1 series · 14 of 25 positions shown · non-contrast
Comparison: None.

CLINICAL DATA: Right upper quadrant pain for a few years.

EXAM:
ULTRASOUND ABDOMEN LIMITED RIGHT UPPER QUADRANT

[Series 1: us abdomen limited · 0.15mm/px · 14 of 46 slices shown]
[im 1/46]
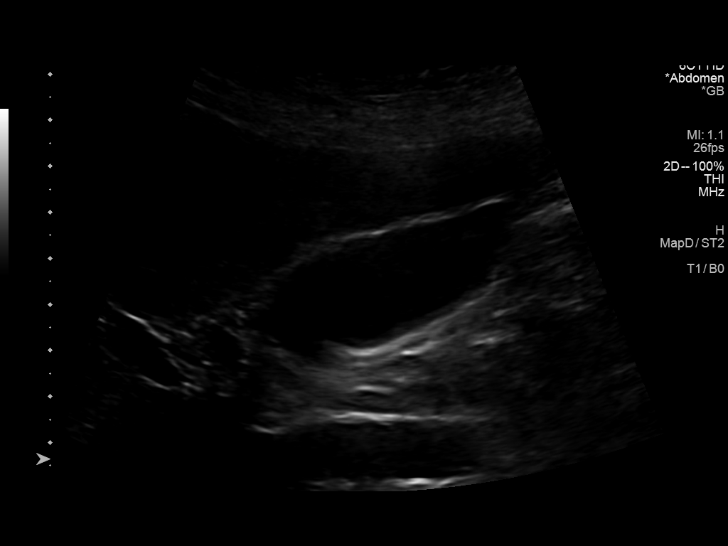
[im 4/46]
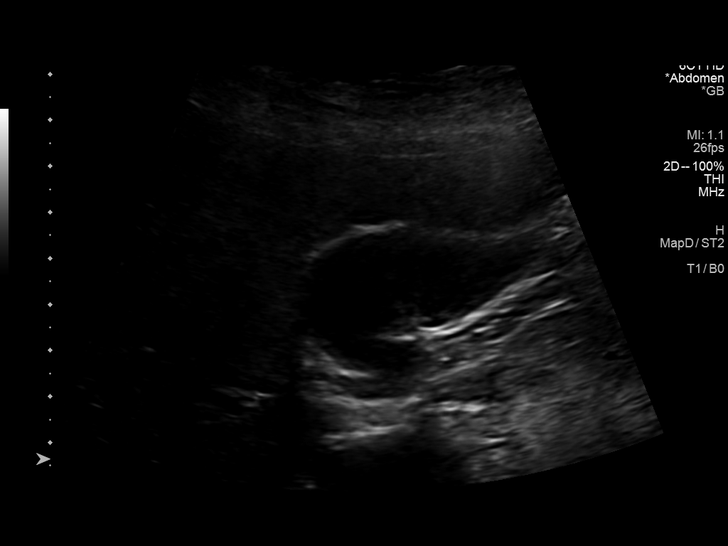
[im 8/46]
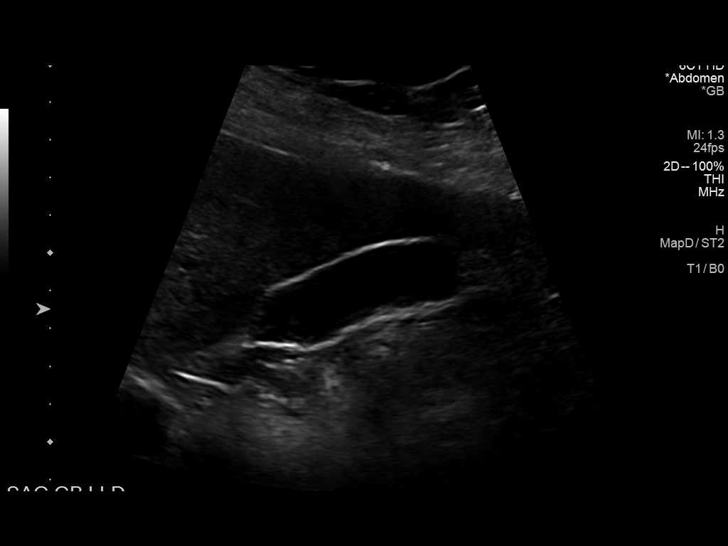
[im 12/46]
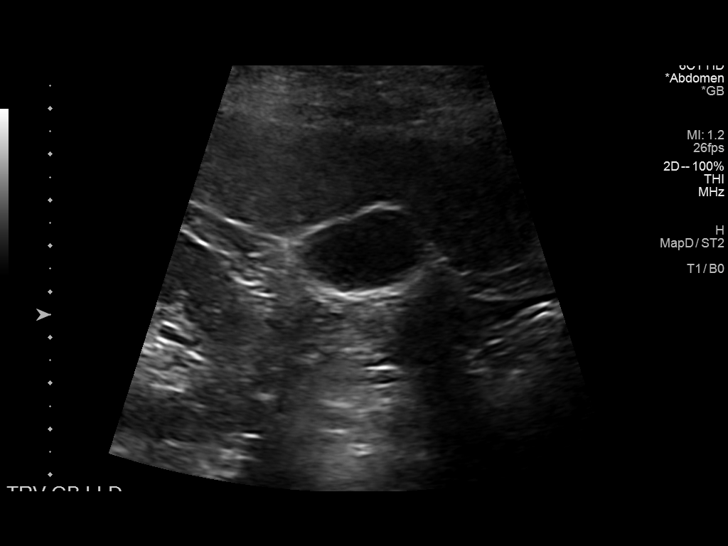
[im 16/46]
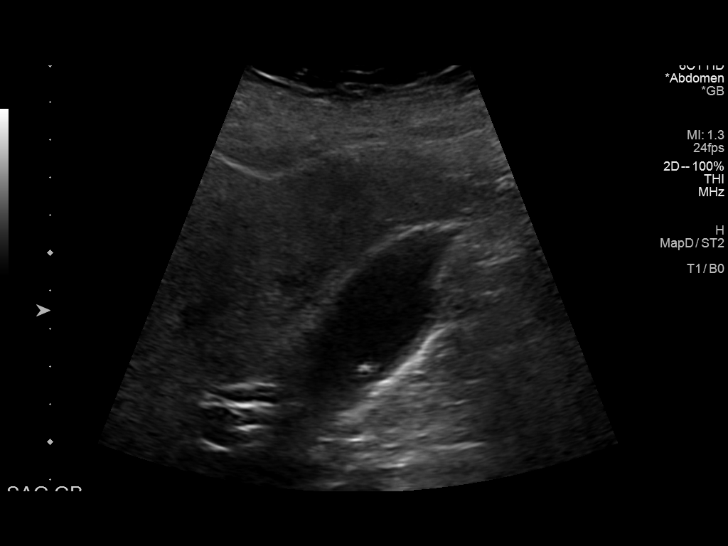
[im 17/46]
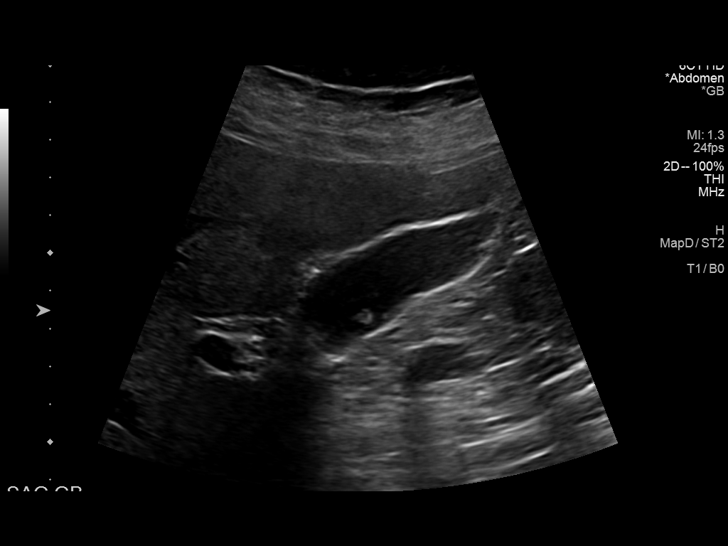
[im 21/46]
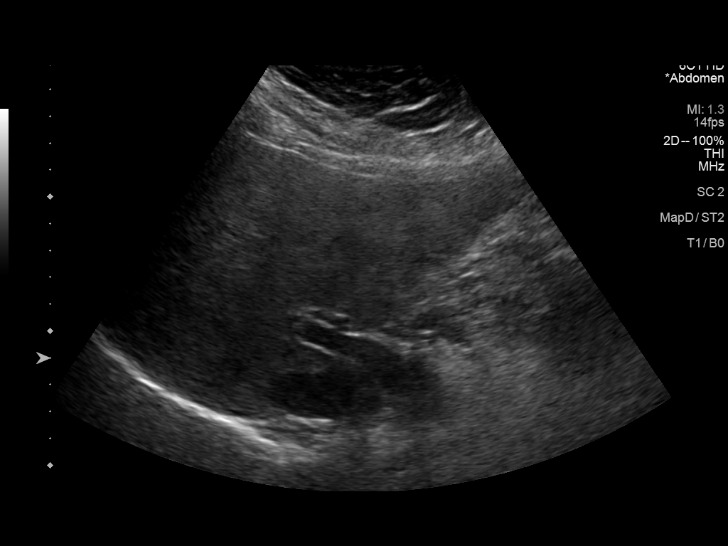
[im 25/46]
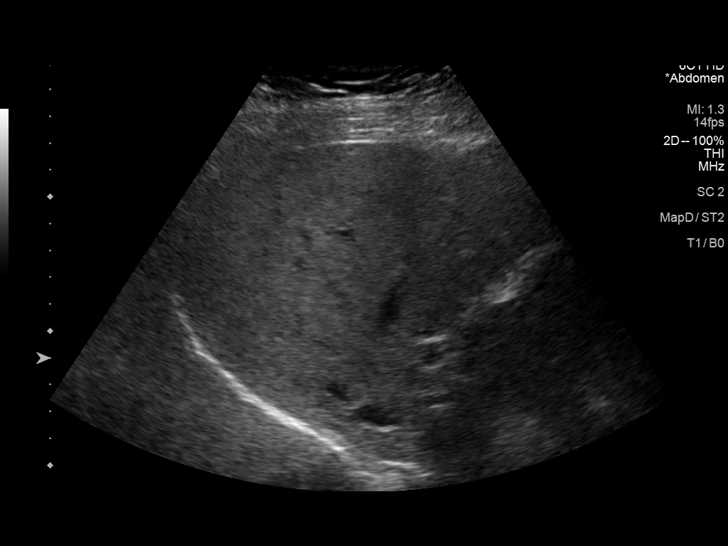
[im 29/46]
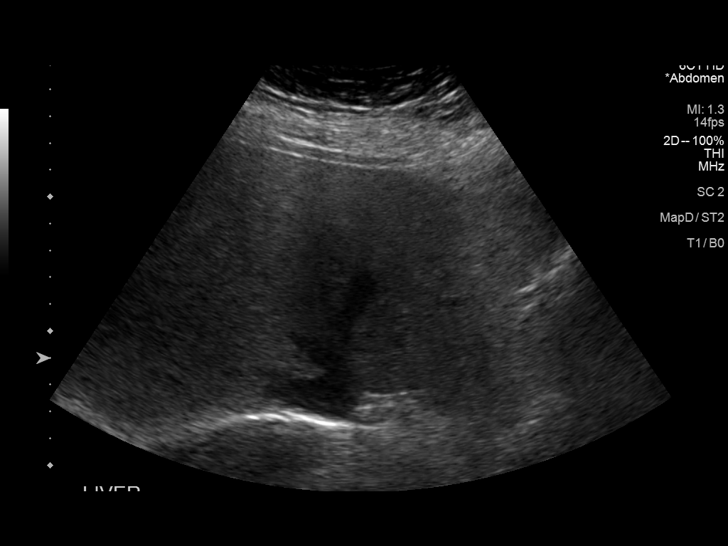
[im 31/46]
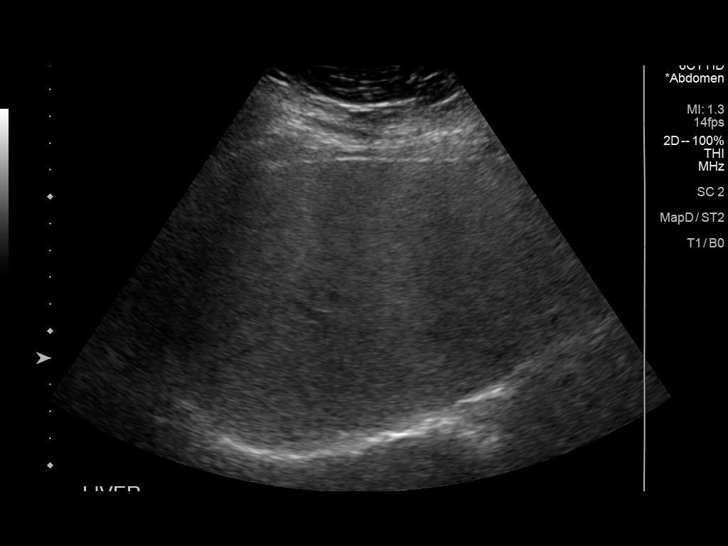
[im 34/46]
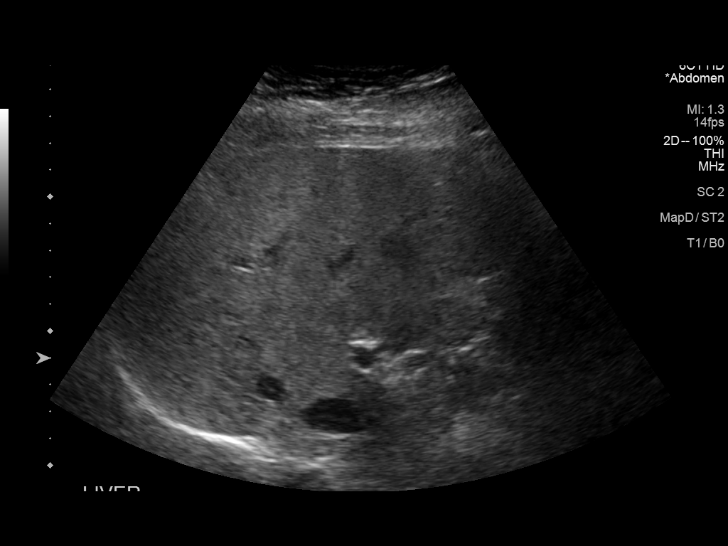
[im 38/46]
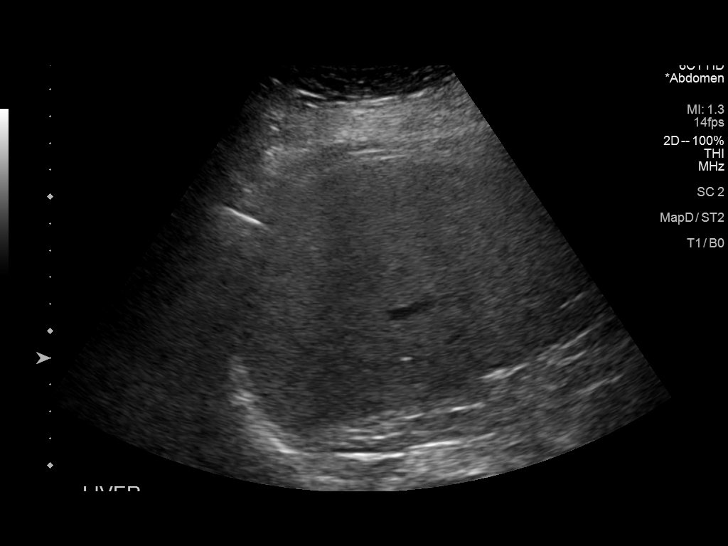
[im 42/46]
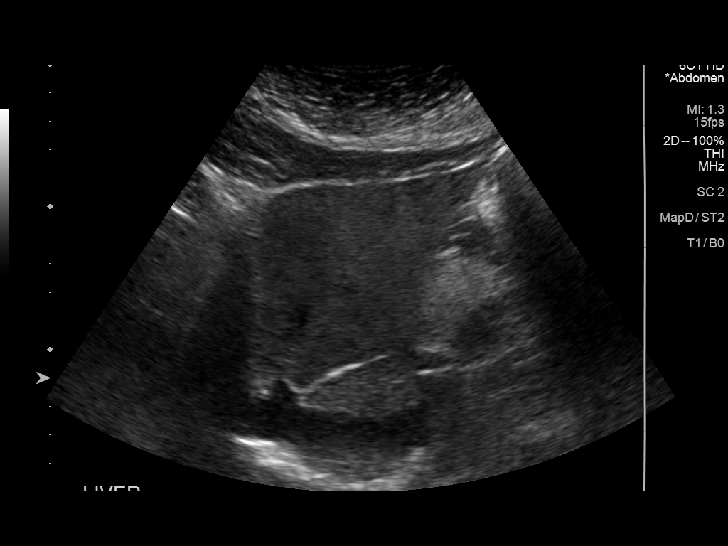
[im 46/46]
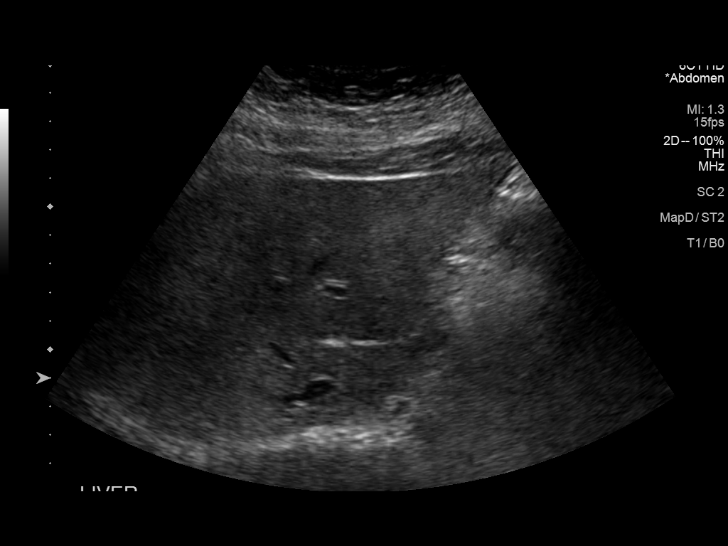

[14 of 25 positions shown; findings below may reference images not displayed]

FINDINGS: Gallbladder:

No gallstones or wall thickening visualized. A 3 mm polyp is
presumed benign given its size. No sonographic Murphy sign noted by
sonographer.

Common bile duct:

Diameter: 2 mm

Liver:

No focal lesion identified. Liver parenchymal echogenicity is
increased and demonstrates a coarsened echotexture. Portal vein is
patent on color Doppler imaging with normal direction of blood flow
towards the liver.

Other: None.
IMPRESSION: Increased liver echogenicity is consistent with hepatic steatosis.
Coarsened liver echotexture is suspicious for diffuse hepatocellular
disease including cirrhosis.

## 2022-01-31 ENCOUNTER — Encounter: Payer: Self-pay | Admitting: Vascular Surgery

## 2022-01-31 ENCOUNTER — Other Ambulatory Visit: Payer: Self-pay

## 2022-01-31 ENCOUNTER — Ambulatory Visit (INDEPENDENT_AMBULATORY_CARE_PROVIDER_SITE_OTHER): Payer: BC Managed Care – PPO | Admitting: Vascular Surgery

## 2022-01-31 VITALS — BP 155/98 | HR 80 | Temp 97.7°F | Resp 18 | Ht 62.0 in | Wt 305.2 lb

## 2022-01-31 DIAGNOSIS — I83812 Varicose veins of left lower extremities with pain: Secondary | ICD-10-CM

## 2022-01-31 DIAGNOSIS — I872 Venous insufficiency (chronic) (peripheral): Secondary | ICD-10-CM

## 2022-01-31 NOTE — Progress Notes (Signed)
? ?ASSESSMENT & PLAN  ? ?CHRONIC VENOUS INSUFFICIENCY: This patient has symptoms consistent with venous hypertension.  She appears to have a dilated great saphenous vein on the left but has not had formal venous reflux testing.  We have discussed the importance of intermittent leg elevation and the proper positioning for this.  We will also try to get her fitted for some pantyhose style compression stockings with a gradient of 20 to 30 mmHg.  I have encouraged her to avoid prolonged sitting and standing.  We have discussed the importance of exercise specifically walking and water aerobics.  We also discussed the importance of maintaining a healthy weight as central obesity especially increases lower extremity venous pressure.  If her symptoms progress she will call and we can get formal venous reflux testing. ? ?REASON FOR CONSULT:   ? ?Varicose veins of the left leg.  The consult is requested by Bing Matter, PA. ? ?HPI:  ? ?Donna Melendez is a 51 y.o. female who was referred with painful varicose veins.  I have reviewed the records from the referring office.  The patient was seen on 01/17/2022.  She was having a cough.  She was felt to have a viral upper respiratory tract infection.  She also was noted to have varicose veins of her left lower extremity was having significant pain.  This reason she was sent for vascular consultation. ? ?On my history, the patient has had pain in her lateral thighs for years but over the last 6 months her symptoms have increased significantly.  She works in [employer], [employer] and works 12-hour shifts 3 days a week.  Her symptoms are worse when she is standing.  Of note she does have some chronic low back pain also.  She describes aching pain and a tired feeling in her legs which is aggravated by standing and relieved somewhat with elevation.  She also has swelling in her legs.  Her symptoms are worse on the left side.  She had no previous history of DVT and no previous venous  procedures. ? ?She currently denies any large prominent varicose veins just the spider veins.  She does state that when she had lost 90 pounds in the past her varicose veins were more prominent. ? ?Past Medical History:  ?Diagnosis Date  ? GERD (gastroesophageal reflux disease)   ? With spicy foods  ? Headache   ? hx of migraines  ? Hypertension   ? Pre-diabetes   ? Sleep apnea   ? no official diagnosis  ? Thyroid disease   ? ? ?Family History  ?Problem Relation Age of Onset  ? Heart disease Mother   ? Hypertension Mother   ? Diabetes Mother   ? Ulcers Sister   ? Hypertension Maternal Grandmother   ? Heart disease Maternal Grandmother   ? Diabetes Maternal Grandmother   ? Cancer Paternal Grandfather   ?     unknown type of cancer  ? Diabetes Maternal Aunt   ? Diabetes Cousin   ? ? ?SOCIAL HISTORY: ?Social History  ? ?Tobacco Use  ? Smoking status: Never  ? Smokeless tobacco: Never  ?Substance Use Topics  ? Alcohol use: Yes  ?  Comment: 2-3 drinks per year  ? ? ?Allergies  ?Allergen Reactions  ? Codeine Nausea And Vomiting  ? Hydrocodone Nausea And Vomiting  ? Keflex [Cephalexin] Itching  ? ? ?Current Outpatient Medications  ?Medication Sig Dispense Refill  ? amLODipine (NORVASC) 5 MG tablet Take 5 mg by mouth  every morning.    ? ibuprofen (ADVIL,MOTRIN) 200 MG tablet Take 600 mg by mouth every 8 (eight) hours as needed (for pain).    ? levothyroxine (SYNTHROID) 25 MCG tablet Take by mouth daily.    ? losartan (COZAAR) 50 MG tablet Take 1 tablet by mouth daily.    ? Vitamin D, Ergocalciferol, (DRISDOL) 50000 units CAPS capsule Take 1 capsule (50,000 Units total) by mouth every 7 (seven) days. 12 capsule 1  ? cyclobenzaprine (FLEXERIL) 5 MG tablet Take 5-10 mg by mouth 3 (three) times daily as needed. (Patient not taking: Reported on 01/31/2022)    ? estradiol (ESTRACE) 0.5 MG tablet TAKE 1 TABLET DAILY (Patient not taking: Reported on 01/31/2022) 90 tablet 0  ? HYDROmorphone (DILAUDID) 2 MG tablet Take 1 tablet (2 mg  total) by mouth every 4 (four) hours as needed for moderate pain. (Patient not taking: Reported on 01/31/2022) 30 tablet 0  ? olmesartan-hydrochlorothiazide (BENICAR HCT) 40-12.5 MG tablet Take 1 tablet by mouth every morning. (Patient not taking: Reported on 01/31/2022)    ? senna (SENOKOT) 8.6 MG TABS tablet Take 1 tablet (8.6 mg total) by mouth at bedtime as needed for mild constipation. (Patient not taking: Reported on 01/31/2022) 120 each 0  ? ?No current facility-administered medications for this visit.  ? ? ?REVIEW OF SYSTEMS:  ?'[X]'$  denotes positive finding, '[ ]'$  denotes negative finding ?Cardiac  Comments:  ?Chest pain or chest pressure:    ?Shortness of breath upon exertion:    ?Short of breath when lying flat:    ?Irregular heart rhythm:    ?    ?Vascular    ?Pain in calf, thigh, or hip brought on by ambulation:    ?Pain in feet at night that wakes you up from your sleep:     ?Blood clot in your veins:    ?Leg swelling:  x   ?    ?Pulmonary    ?Oxygen at home:    ?Productive cough:     ?Wheezing:     ?    ?Neurologic    ?Sudden weakness in arms or legs:     ?Sudden numbness in arms or legs:     ?Sudden onset of difficulty speaking or slurred speech:    ?Temporary loss of vision in one eye:     ?Problems with dizziness:     ?    ?Gastrointestinal    ?Blood in stool:     ?Vomited blood:     ?    ?Genitourinary    ?Burning when urinating:     ?Blood in urine:    ?    ?Psychiatric    ?Major depression:     ?    ?Hematologic    ?Bleeding problems:    ?Problems with blood clotting too easily:    ?    ?Skin    ?Rashes or ulcers:    ?    ?Constitutional    ?Fever or chills:    ?- ? ?PHYSICAL EXAM:  ? ?Vitals:  ? 01/31/22 0902  ?BP: (!) 155/98  ?Pulse: 80  ?Resp: 18  ?Temp: 97.7 ?F (36.5 ?C)  ?TempSrc: Temporal  ?SpO2: 95%  ?Weight: (!) 305 lb 3.2 oz (138.4 kg)  ?Height: '5\' 2"'$  (1.575 m)  ? ?Body mass index is 55.82 kg/m?. ?GENERAL: The patient is a well-nourished female, in no acute distress. The vital signs are  documented above. ?CARDIAC: There is a regular rate and rhythm.  ?VASCULAR: I do not  detect carotid bruits. ?She has palpable pedal pulses. ?She has telangiectasias in both lateral thighs.  I did not see any large truncal varicosities.  These may however be deeper given her obesity. ? ?I did look at her left great saphenous vein myself with the Sono[employer].  It does appear to be significantly dilated potentially with some reflux in the thigh. ?PULMONARY: There is good air exchange bilaterally without wheezing or rales. ?ABDOMEN: Soft and non-tender with normal pitched bowel sounds.  ?MUSCULOSKELETAL: There are no major deformities. ?NEUROLOGIC: No focal weakness or paresthesias are detected. ?SKIN: There are no ulcers or rashes noted. ?PSYCHIATRIC: The patient has a normal affect. ? ?DATA:   ? ?She did not have formal venous reflux testing today. ? ?Deitra Mayo ?[facility] ?

## 2023-04-10 ENCOUNTER — Ambulatory Visit (INDEPENDENT_AMBULATORY_CARE_PROVIDER_SITE_OTHER): Payer: BC Managed Care – PPO | Admitting: Plastic Surgery

## 2023-04-10 ENCOUNTER — Encounter: Payer: Self-pay | Admitting: Plastic Surgery

## 2023-04-10 VITALS — BP 164/98 | HR 91

## 2023-04-10 DIAGNOSIS — R220 Localized swelling, mass and lump, head: Secondary | ICD-10-CM | POA: Diagnosis not present

## 2023-04-10 DIAGNOSIS — D489 Neoplasm of uncertain behavior, unspecified: Secondary | ICD-10-CM
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
## 2023-04-16 ENCOUNTER — Telehealth: Payer: Self-pay

## 2023-04-16 NOTE — Telephone Encounter (Signed)
NA

## 2023-04-19 ENCOUNTER — Ambulatory Visit (HOSPITAL_BASED_OUTPATIENT_CLINIC_OR_DEPARTMENT_OTHER)
Admission: RE | Admit: 2023-04-19 | Discharge: 2023-04-19 | Disposition: A | Discharge status: Home / Self Care | Payer: BC Managed Care – PPO | Source: Ambulatory Visit | Attending: Plastic Surgery | Admitting: Plastic Surgery

## 2023-04-19 DIAGNOSIS — D489 Neoplasm of uncertain behavior, unspecified: Secondary | ICD-10-CM | POA: Insufficient documentation

## 2023-04-22 ENCOUNTER — Encounter: Payer: Self-pay | Admitting: Surgical

## 2023-04-22 ENCOUNTER — Ambulatory Visit (INDEPENDENT_AMBULATORY_CARE_PROVIDER_SITE_OTHER): Payer: BC Managed Care – PPO | Admitting: Surgical

## 2023-04-22 VITALS — BP 155/109 | HR 92 | Ht 62.0 in | Wt 315.0 lb

## 2023-04-22 DIAGNOSIS — D489 Neoplasm of uncertain behavior, unspecified: Secondary | ICD-10-CM

## 2023-04-22 MED ORDER — ONDANSETRON HCL 4 MG PO TABS: 4.0000 mg | ORAL_TABLET | Freq: Three times a day (TID) | ORAL | 0 refills | Status: AC | PRN

## 2023-04-22 MED ORDER — OXYCODONE HCL 5 MG PO TABS: 5.0000 mg | ORAL_TABLET | Freq: Four times a day (QID) | ORAL | 0 refills | Status: AC | PRN
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
## 2023-04-22 NOTE — Addendum Note (Signed)
Addended byKeenan Bachelor on: 04/22/2023 03:15 PM   Modules accepted: Orders

## 2023-04-23 ENCOUNTER — Telehealth: Payer: Self-pay | Admitting: *Deleted

## 2023-04-23 NOTE — Telephone Encounter (Signed)
Surgical clearance received from pt's PCP - copy given to [facility] (on desk) and copy sent to batch scan. Pt is cleared for surgery.

## 2023-04-23 NOTE — Telephone Encounter (Signed)
Request for surgical clearance faxed to Mady Gemma PA-C with fax confirmation received. Fax # [phone]

## 2023-04-30 NOTE — Progress Notes (Signed)
Left message for Donna Seta E., at Dr. Lubertha Basque office, to notify that patient is not a candidate for surgery at [facility].

## 2023-05-01 ENCOUNTER — Ambulatory Visit: Payer: BC Managed Care – PPO | Admitting: Plastic Surgery

## 2023-05-01 ENCOUNTER — Telehealth: Payer: Self-pay | Admitting: Surgical

## 2023-05-01 NOTE — Telephone Encounter (Signed)
Pt had her preop on 6-17 and her sx is 7-2, wants to speak with a provider on a reminder of food and medication she should do before sx

## 2023-05-01 NOTE — Telephone Encounter (Signed)
Thank you.  And yes, that could explain the bone loss. Hopefully, that's all it is.

## 2023-05-01 NOTE — Telephone Encounter (Signed)
I spoke with Ms.Gwyndolyn Kaufman. She had questions regarding when to eat/drink before surgery and if she should take he BP medication that morning. I advised her that someone from the OR would call her closer to her surgery date and give her specific instructions for the day before and day of the procedure. Also that she could call the OR number on her surgery information form if she does not hear from them in a timely manner. I explained that we recommend eating adequate protein and limiting processed/starchy carbs and sugars before surgery. She expressed understanding of the above.  She also wanted to inform Dr. Ladona Ridgel that years ago she was being treated for migraine and had multiple injections on her forehead, scalp, neck, back, and shoulders of something "in the cortisone family" and she wondered if this could be causing the bone loss shown on her CT.

## 2023-05-01 NOTE — Telephone Encounter (Signed)
Joss, do you mind calling patient?  For food, just recommend high protein, low carbs.  In regards to meds, I sent her in oxycodone and zofran to her pharmacy.  I do not see any specific meds she needs to hold.

## 2023-05-03 ENCOUNTER — Other Ambulatory Visit: Payer: Self-pay

## 2023-05-03 ENCOUNTER — Encounter (HOSPITAL_COMMUNITY): Payer: Self-pay | Admitting: Plastic Surgery

## 2023-05-03 NOTE — Progress Notes (Signed)
Spoke with pt for pre-op call. Pt denies cardiac history. Pt is treated for HTN and is pre-diabetic. Last A1C was 6.6 on 11/14/22. Pt states she had an ear infection on 04/22/23 and took antibiotics for it and does not have any symptoms now.   Shower instructions given to pt.

## 2023-05-07 ENCOUNTER — Ambulatory Visit (HOSPITAL_COMMUNITY): Payer: BC Managed Care – PPO | Admitting: Anesthesiology

## 2023-05-07 ENCOUNTER — Encounter (HOSPITAL_COMMUNITY)
Admission: RE | Disposition: A | Discharge status: Home / Self Care | Payer: Self-pay | Source: Home / Self Care | Attending: Plastic Surgery

## 2023-05-07 ENCOUNTER — Ambulatory Visit (HOSPITAL_COMMUNITY)
Admission: RE | Admit: 2023-05-07 | Discharge: 2023-05-07 | Disposition: A | Discharge status: Home / Self Care | Payer: BC Managed Care – PPO | Attending: Plastic Surgery | Admitting: Plastic Surgery

## 2023-05-07 ENCOUNTER — Other Ambulatory Visit: Payer: Self-pay

## 2023-05-07 ENCOUNTER — Telehealth: Payer: Self-pay | Admitting: Student

## 2023-05-07 DIAGNOSIS — Z79899 Other long term (current) drug therapy: Secondary | ICD-10-CM | POA: Diagnosis not present

## 2023-05-07 DIAGNOSIS — L929 Granulomatous disorder of the skin and subcutaneous tissue, unspecified: Secondary | ICD-10-CM

## 2023-05-07 DIAGNOSIS — Z6841 Body Mass Index (BMI) 40.0 and over, adult: Secondary | ICD-10-CM | POA: Diagnosis not present

## 2023-05-07 DIAGNOSIS — R220 Localized swelling, mass and lump, head: Secondary | ICD-10-CM | POA: Diagnosis present

## 2023-05-07 DIAGNOSIS — I10 Essential (primary) hypertension: Secondary | ICD-10-CM | POA: Insufficient documentation

## 2023-05-07 DIAGNOSIS — L928 Other granulomatous disorders of the skin and subcutaneous tissue: Secondary | ICD-10-CM | POA: Insufficient documentation

## 2023-05-07 HISTORY — DX: Malignant (primary) neoplasm, unspecified: C80.1

## 2023-05-07 HISTORY — DX: Unspecified osteoarthritis, unspecified site: M19.90

## 2023-05-07 HISTORY — DX: Pneumonia, unspecified organism: J18.9

## 2023-05-07 HISTORY — PX: LESION REMOVAL: SHX5196

## 2023-05-07 LAB — CBC
HCT: 42.3 % (ref 36.0–46.0)
Hemoglobin: 13.6 g/dL (ref 12.0–15.0)
MCH: 28.9 pg (ref 26.0–34.0)
MCHC: 32.2 g/dL (ref 30.0–36.0)
MCV: 90 fL (ref 80.0–100.0)
Platelets: 278 10*3/uL (ref 150–400)
RBC: 4.7 MIL/uL (ref 3.87–5.11)
RDW: 13.2 % (ref 11.5–15.5)
WBC: 8.6 10*3/uL (ref 4.0–10.5)
nRBC: 0 % (ref 0.0–0.2)

## 2023-05-07 LAB — BASIC METABOLIC PANEL
Anion gap: 10 (ref 5–15)
BUN: 13 mg/dL (ref 6–20)
CO2: 24 mmol/L (ref 22–32)
Calcium: 9.1 mg/dL (ref 8.9–10.3)
Chloride: 103 mmol/L (ref 98–111)
Creatinine, Ser: 0.87 mg/dL (ref 0.44–1.00)
GFR, Estimated: >60 mL/min (ref >60)
Glucose, Bld: 100 mg/dL — ABNORMAL HIGH (ref 70–99)
Potassium: 3.5 mmol/L (ref 3.5–5.1)
Sodium: 137 mmol/L (ref 135–145)

## 2023-05-07 SURGERY — WIDE EXCISION, LESION, UPPER EXTREMITY
Anesthesia: General | Site: Face

## 2023-05-07 MED ORDER — ACETAMINOPHEN 500 MG PO TABS
ORAL_TABLET | ORAL | Status: AC
  Administered 2023-05-07: 1000 mg via ORAL
  Filled 2023-05-07: qty 2

## 2023-05-07 MED ORDER — LIDOCAINE 2% (20 MG/ML) 5 ML SYRINGE
INTRAMUSCULAR | Status: DC | PRN
  Administered 2023-05-07: 100 mg via INTRAVENOUS

## 2023-05-07 MED ORDER — ROCURONIUM BROMIDE 10 MG/ML (PF) SYRINGE
PREFILLED_SYRINGE | INTRAVENOUS | Status: DC | PRN
  Administered 2023-05-07 (×2): 30 mg via INTRAVENOUS

## 2023-05-07 MED ORDER — MIDAZOLAM HCL 2 MG/2ML IJ SOLN
INTRAMUSCULAR | Status: DC | PRN
  Administered 2023-05-07 (×2): 1 mg via INTRAVENOUS

## 2023-05-07 MED ORDER — HEMOSTATIC AGENTS (NO CHARGE) OPTIME
TOPICAL | Status: DC | PRN
  Administered 2023-05-07: 1 via TOPICAL

## 2023-05-07 MED ORDER — BUPIVACAINE-EPINEPHRINE (PF) 0.25% -1:200000 IJ SOLN
INTRAMUSCULAR | Status: AC
  Filled 2023-05-07: qty 30

## 2023-05-07 MED ORDER — VANCOMYCIN HCL 1500 MG/300ML IV SOLN
1500.0000 mg | INTRAVENOUS | Status: AC
  Administered 2023-05-07: 1500 mg via INTRAVENOUS
  Filled 2023-05-07: qty 300

## 2023-05-07 MED ORDER — LACTATED RINGERS IV SOLN: INTRAVENOUS | Status: DC

## 2023-05-07 MED ORDER — MIDAZOLAM HCL 2 MG/2ML IJ SOLN
INTRAMUSCULAR | Status: AC
  Filled 2023-05-07: qty 2

## 2023-05-07 MED ORDER — FENTANYL CITRATE (PF) 100 MCG/2ML IJ SOLN
25.0000 ug | INTRAMUSCULAR | Status: DC | PRN
  Administered 2023-05-07 (×2): 25 ug via INTRAVENOUS

## 2023-05-07 MED ORDER — ONDANSETRON HCL 4 MG/2ML IJ SOLN
INTRAMUSCULAR | Status: DC | PRN
  Administered 2023-05-07: 4 mg via INTRAVENOUS

## 2023-05-07 MED ORDER — SUGAMMADEX SODIUM 200 MG/2ML IV SOLN
INTRAVENOUS | Status: DC | PRN
  Administered 2023-05-07: 400 mg via INTRAVENOUS

## 2023-05-07 MED ORDER — PROPOFOL 10 MG/ML IV BOLUS
INTRAVENOUS | Status: AC
  Filled 2023-05-07: qty 20

## 2023-05-07 MED ORDER — OXYCODONE HCL 5 MG PO TABS: 5.0000 mg | ORAL_TABLET | Freq: Once | ORAL | Status: DC | PRN

## 2023-05-07 MED ORDER — 0.9 % SODIUM CHLORIDE (POUR BTL) OPTIME
TOPICAL | Status: DC | PRN
  Administered 2023-05-07: 1000 mL

## 2023-05-07 MED ORDER — CHLORHEXIDINE GLUCONATE CLOTH 2 % EX PADS: 6.0000 | MEDICATED_PAD | Freq: Once | CUTANEOUS | Status: DC

## 2023-05-07 MED ORDER — LIDOCAINE 2% (20 MG/ML) 5 ML SYRINGE
INTRAMUSCULAR | Status: AC
  Filled 2023-05-07: qty 5

## 2023-05-07 MED ORDER — FENTANYL CITRATE (PF) 250 MCG/5ML IJ SOLN
INTRAMUSCULAR | Status: DC | PRN
  Administered 2023-05-07 (×2): 100 ug via INTRAVENOUS
  Administered 2023-05-07: 50 ug via INTRAVENOUS

## 2023-05-07 MED ORDER — DEXAMETHASONE SODIUM PHOSPHATE 10 MG/ML IJ SOLN
INTRAMUSCULAR | Status: DC | PRN
  Administered 2023-05-07: 6 mg via INTRAVENOUS

## 2023-05-07 MED ORDER — BUPIVACAINE-EPINEPHRINE 0.25% -1:200000 IJ SOLN
INTRAMUSCULAR | Status: DC | PRN
  Administered 2023-05-07: 5 mL

## 2023-05-07 MED ORDER — ACETAMINOPHEN 500 MG PO TABS: 1000.0000 mg | ORAL_TABLET | Freq: Once | ORAL | Status: AC

## 2023-05-07 MED ORDER — CHLORHEXIDINE GLUCONATE 0.12 % MT SOLN: 15.0000 mL | Freq: Once | OROMUCOSAL | Status: AC

## 2023-05-07 MED ORDER — SUCCINYLCHOLINE CHLORIDE 200 MG/10ML IV SOSY
PREFILLED_SYRINGE | INTRAVENOUS | Status: AC
  Filled 2023-05-07: qty 10

## 2023-05-07 MED ORDER — AMISULPRIDE (ANTIEMETIC) 5 MG/2ML IV SOLN: 10.0000 mg | Freq: Once | INTRAVENOUS | Status: DC | PRN

## 2023-05-07 MED ORDER — FENTANYL CITRATE (PF) 250 MCG/5ML IJ SOLN
INTRAMUSCULAR | Status: AC
  Filled 2023-05-07: qty 5

## 2023-05-07 MED ORDER — CHLORHEXIDINE GLUCONATE 0.12 % MT SOLN
OROMUCOSAL | Status: AC
  Administered 2023-05-07: 15 mL via OROMUCOSAL
  Filled 2023-05-07: qty 15

## 2023-05-07 MED ORDER — PROPOFOL 10 MG/ML IV BOLUS
INTRAVENOUS | Status: DC | PRN
  Administered 2023-05-07: 200 mg via INTRAVENOUS

## 2023-05-07 MED ORDER — ONDANSETRON HCL 4 MG/2ML IJ SOLN
INTRAMUSCULAR | Status: AC
  Filled 2023-05-07: qty 2

## 2023-05-07 MED ORDER — PHENYLEPHRINE 80 MCG/ML (10ML) SYRINGE FOR IV PUSH (FOR BLOOD PRESSURE SUPPORT)
PREFILLED_SYRINGE | INTRAVENOUS | Status: DC | PRN
  Administered 2023-05-07 (×4): 80 ug via INTRAVENOUS

## 2023-05-07 MED ORDER — ROCURONIUM BROMIDE 10 MG/ML (PF) SYRINGE
PREFILLED_SYRINGE | INTRAVENOUS | Status: AC
  Filled 2023-05-07: qty 10

## 2023-05-07 MED ORDER — FENTANYL CITRATE (PF) 100 MCG/2ML IJ SOLN
INTRAMUSCULAR | Status: AC
  Filled 2023-05-07: qty 2

## 2023-05-07 MED ORDER — DEXAMETHASONE SODIUM PHOSPHATE 10 MG/ML IJ SOLN
INTRAMUSCULAR | Status: AC
  Filled 2023-05-07: qty 1

## 2023-05-07 MED ORDER — ORAL CARE MOUTH RINSE: 15.0000 mL | Freq: Once | OROMUCOSAL | Status: AC

## 2023-05-07 MED ORDER — SUCCINYLCHOLINE CHLORIDE 200 MG/10ML IV SOSY
PREFILLED_SYRINGE | INTRAVENOUS | Status: DC | PRN
  Administered 2023-05-07: 160 mg via INTRAVENOUS

## 2023-05-07 MED ORDER — OXYCODONE HCL 5 MG/5ML PO SOLN: 5.0000 mg | Freq: Once | ORAL | Status: DC | PRN

## 2023-05-07 SURGICAL SUPPLY — 42 items
BAG COUNTER SPONGE SURGICOUNT (BAG) ×1 IMPLANT
BAG SPNG CNTER NS LX DISP (BAG)
CANISTER SUCT 3000ML PPV (MISCELLANEOUS) IMPLANT
CNTNR URN SCR LID CUP LEK RST (MISCELLANEOUS) ×1 IMPLANT
CONT SPEC 4OZ STRL OR WHT (MISCELLANEOUS) ×1
COVER SURGICAL LIGHT HANDLE (MISCELLANEOUS) ×1 IMPLANT
DRESSING MEPILEX FLEX 4X4 (GAUZE/BANDAGES/DRESSINGS) IMPLANT
DRSG EMULSION OIL 3X3 NADH (GAUZE/BANDAGES/DRESSINGS) IMPLANT
DRSG MEPILEX FLEX 4X4 (GAUZE/BANDAGES/DRESSINGS) ×1
ELECT CAUTERY BLADE 6.4 (BLADE) IMPLANT
ELECT NDL TIP 2.8 STRL (NEEDLE) IMPLANT
ELECT NEEDLE TIP 2.8 STRL (NEEDLE) IMPLANT
ELECT REM PT RETURN 9FT ADLT (ELECTROSURGICAL) ×1
ELECTRODE REM PT RTRN 9FT ADLT (ELECTROSURGICAL) ×1 IMPLANT
GAUZE 4X4 16PLY ~~LOC~~+RFID DBL (SPONGE) ×1 IMPLANT
GAUZE PAD ABD 7.5X8 STRL (GAUZE/BANDAGES/DRESSINGS) IMPLANT
GAUZE SPONGE 2X2 8PLY STRL LF (GAUZE/BANDAGES/DRESSINGS) IMPLANT
GAUZE SPONGE 4X4 12PLY STRL (GAUZE/BANDAGES/DRESSINGS) IMPLANT
GLOVE BIO SURGEON STRL SZ 6.5 (GLOVE) ×1 IMPLANT
GOWN STRL REUS W/ TWL LRG LVL3 (GOWN DISPOSABLE) ×2 IMPLANT
GOWN STRL REUS W/TWL LRG LVL3 (GOWN DISPOSABLE) ×2
HEMOSTAT SURGICEL 2X14 (HEMOSTASIS) IMPLANT
KIT BASIN OR (CUSTOM PROCEDURE TRAY) ×1 IMPLANT
KIT TURNOVER KIT B (KITS) ×1 IMPLANT
NDL HYPO 25GX1X1/2 BEV (NEEDLE) ×1 IMPLANT
NEEDLE HYPO 25GX1X1/2 BEV (NEEDLE) ×1 IMPLANT
NS IRRIG 1000ML POUR BTL (IV SOLUTION) ×1 IMPLANT
PACK BASIC III (CUSTOM PROCEDURE TRAY) ×1
PACK SRG BSC III STRL LF ECLPS (CUSTOM PROCEDURE TRAY) ×1 IMPLANT
PAD ARMBOARD 7.5X6 YLW CONV (MISCELLANEOUS) ×1 IMPLANT
PENCIL BUTTON HOLSTER BLD 10FT (ELECTRODE) ×1 IMPLANT
POWDER SURGICEL 3.0 GRAM (HEMOSTASIS) IMPLANT
SUT MNCRL AB 3-0 PS2 27 (SUTURE) IMPLANT
SUT PROLENE 5 0 PS 2 (SUTURE) IMPLANT
SUT SILK 3 0 PS 1 (SUTURE) IMPLANT
SUT VIC AB 3-0 SH 27 (SUTURE) ×2
SUT VIC AB 3-0 SH 27X BRD (SUTURE) IMPLANT
SYR BULB EAR ULCER 3OZ GRN STR (SYRINGE) IMPLANT
SYR CONTROL 10ML LL (SYRINGE) IMPLANT
TOWEL GREEN STERILE (TOWEL DISPOSABLE) ×1 IMPLANT
TOWEL GREEN STERILE FF (TOWEL DISPOSABLE) ×1 IMPLANT
YANKAUER SUCT BULB TIP NO VENT (SUCTIONS) IMPLANT

## 2023-05-07 NOTE — Anesthesia Procedure Notes (Signed)
Procedure Name: Intubation Date/Time: 05/07/2023 2:20 PM  Performed by: Marena Chancy, CRNAPre-anesthesia Checklist: Patient identified, Emergency Drugs available, Suction available and Patient being monitored Patient Re-evaluated:Patient Re-evaluated prior to induction Oxygen Delivery Method: Circle System Utilized Preoxygenation: Pre-oxygenation with 100% oxygen Induction Type: IV induction Laryngoscope Size: Glidescope and 3 Grade View: Grade II Tube type: Oral Tube size: 7.0 mm Number of attempts: 1 Airway Equipment and Method: Stylet and Oral airway Placement Confirmation: ETT inserted through vocal cords under direct vision, positive ETCO2 and breath sounds checked- equal and bilateral Tube secured with: Tape Dental Injury: Teeth and Oropharynx as per pre-operative assessment

## 2023-05-07 NOTE — Telephone Encounter (Signed)
Call and left vmail for pt to call the office. Trying to see if the pt can come in tomorrow for a post op visit

## 2023-05-07 NOTE — Progress Notes (Signed)
Notified Dr. Stephannie Peters of pt's blood pressure. No new orders.

## 2023-05-07 NOTE — Discharge Instructions (Addendum)
Activity as tolerated.  Keep dressings on until you are evaluated in the clinic tomorrow. Keep dressings clean and dry.   NO showers  NO driving while taking pain medications   Do NOT take ibuprofen / HOLD ibuprofen. You may take tylenol for pain.   No heavy activities  Diet: Regular, Try to optimize nutrition with plenty of proteins and vegetables to improve healing. Wound Care: Keep dressing clean & dry.  Do not change dressings  Call doctor if any unusual problems occur such as pain, excessive bleeding, unrelieved nausea/vomiting, fever &/or chills  Follow-up appointment: Make an appointment to be seen in the clinic tomorrow. Call at 8 AM to make the appointment.   Medications: Called in at your pre-op appointment.

## 2023-05-07 NOTE — Interval H&P Note (Signed)
History and Physical Interval Note: No change in exam or indication for surgery. Site marked with her concurrence. All questions answered to her satisfaction. Will proceed with removal of forehead mass at her request  05/07/2023 2:06 PM  Donna Melendez  has presented today for surgery, with the diagnosis of Forehead lesion.  The various methods of treatment have been discussed with the patient and family. After consideration of risks, benefits and other options for treatment, the patient has consented to  Procedure(s): Excision forehead lesion (N/A) as a surgical intervention.  The patient's history has been reviewed, patient examined, no change in status, stable for surgery.  I have reviewed the patient's chart and labs.  Questions were answered to the patient's satisfaction.     [name]

## 2023-05-07 NOTE — Transfer of Care (Signed)
Immediate Anesthesia Transfer of Care Note  Patient: Donna Melendez  Procedure(s) Performed: Excision forehead lesion (Face)  Patient Location: PACU  Anesthesia Type:General  Level of Consciousness: drowsy and patient cooperative  Airway & Oxygen Therapy: Patient Spontanous Breathing and Patient connected to nasal cannula oxygen  Post-op Assessment: Report given to RN and Post -op Vital signs reviewed and stable  Post vital signs: Reviewed and stable  Last Vitals:  Vitals Value Taken Time  BP 155/93 05/07/23 1615  Temp    Pulse 88 05/07/23 1617  Resp 16 05/07/23 1617  SpO2 94 % 05/07/23 1617  Vitals shown include unvalidated device data.  Last Pain:  Vitals:   05/07/23 1244  PainSc: 0-No pain         Complications: No notable events documented.

## 2023-05-07 NOTE — Op Note (Addendum)
DATE OF OPERATION: 05/07/2023  LOCATION: [facility] Main operating Room  PREOPERATIVE DIAGNOSIS: Forehead mass  POSTOPERATIVE DIAGNOSIS: Same  PROCEDURE: Excision of forehead mass  SURGEON: Loren Racer, MD  ASSISTANT: Caroline More  EBL: 30 cc  CONDITION: Stable  COMPLICATIONS: None  INDICATION: The patient, Donna Melendez, is a 52 y.o. female born on 09-04-1971, is here for treatment of a slow-growing mass in the midline of the forehead.  On CT scan there were concerning findings with erosion into the outer table of the frontal bone.  The patient is brought to the operating room for removal of the mass for pathologic diagnosis  PROCEDURE DETAILS:  The patient was seen prior to surgery and marked.   IV antibiotics were given. The patient was taken to the operating room and given a general anesthetic. A standard time out was performed and all information was confirmed by those in the room. SCDs were placed.   A a 4 cm transverse incision was made over the mass with a scalpel and dissection carried out down to the subcutaneous mass.  The mass was isolated with a combination of sharp and blunt dissection.  There was a significant bleeding from all around the mass.  The mass which was approximately 2 cm in width was removed and marked for orientation.  The wound was inspected for bleeding and with moderate difficulty the bleeding was controlled with suture ligation and the electrocautery.  Surgicel was used to align the surgical surfaces.  The subcutaneous tissues were infiltrated with quarter percent Marcaine with epinephrine.  The wound was then closed in layers with 3-0 Monocryl in the deep layers and a running 5-0 Prolene suture in the skin.  A pressure dressing was applied and the patient was awakened from anesthesia.  She was transferred to the recovery room in good condition.  All instrument needle and sponge counts were reported as correct.  The tissue was sent to pathology for routine  examination. The patient was allowed to wake up and taken to recovery room in stable condition at the end of the case. The family was notified at the end of the case.   The advanced practice practitioner (APP) assisted throughout the case.  The APP was essential in retraction and counter traction when needed to make the case progress smoothly.  This retraction and assistance made it possible to see the tissue plans for the procedure.  The assistance was needed for blood control, tissue re-approximation and assisted with closure of the incision site.

## 2023-05-07 NOTE — Anesthesia Preprocedure Evaluation (Addendum)
Anesthesia Evaluation  Patient identified by MRN, date of birth, ID band Patient awake    Reviewed: Allergy & Precautions, NPO status , Patient's Chart, lab work & pertinent test results  History of Anesthesia Complications Negative for: history of anesthetic complications  Airway Mallampati: II  TM Distance: >3 FB Neck ROM: Full    Dental no notable dental hx.    Pulmonary sleep apnea    Pulmonary exam normal        Cardiovascular hypertension, Pt. on medications Normal cardiovascular exam     Neuro/Psych  Headaches    GI/Hepatic negative GI ROS, Neg liver ROS,,,  Endo/Other    Morbid obesity (BMI 58)  Renal/GU negative Renal ROS     Musculoskeletal  (+) Arthritis ,    Abdominal   Peds  Hematology negative hematology ROS (+)   Anesthesia Other Findings Forehead lesion  Reproductive/Obstetrics                              Anesthesia Physical Anesthesia Plan  ASA: 3  Anesthesia Plan: General   Post-op Pain Management: Tylenol PO (pre-op)*   Induction: Intravenous  PONV Risk Score and Plan: 3 and Ondansetron, Dexamethasone, Treatment may vary due to age or medical condition and Midazolam  Airway Management Planned: Oral ETT  Additional Equipment: None  Intra-op Plan:   Post-operative Plan: Extubation in OR  Informed Consent: I have reviewed the patients History and Physical, chart, labs and discussed the procedure including the risks, benefits and alternatives for the proposed anesthesia with the patient or authorized representative who has indicated his/her understanding and acceptance.     Dental advisory given  Plan Discussed with: CRNA  Anesthesia Plan Comments:         Anesthesia Quick Evaluation

## 2023-05-07 NOTE — Anesthesia Postprocedure Evaluation (Signed)
Anesthesia Post Note  Patient: Donna Melendez  Procedure(s) Performed: Excision forehead lesion (Face)     Patient location during evaluation: PACU Anesthesia Type: General Level of consciousness: sedated Pain management: pain level controlled Vital Signs Assessment: post-procedure vital signs reviewed and stable Respiratory status: spontaneous breathing and respiratory function stable Cardiovascular status: stable Postop Assessment: no apparent nausea or vomiting Anesthetic complications: no  No notable events documented.  Last Vitals:  Vitals:   05/07/23 1700 05/07/23 1715  BP: 131/67 124/75  Pulse: 76 70  Resp: 14 10  Temp:  36.4 C  SpO2: 94% 93%    Last Pain:  Vitals:   05/07/23 1715  PainSc: 3                  Sweetie Giebler DANIEL

## 2023-05-08 ENCOUNTER — Encounter (HOSPITAL_COMMUNITY): Payer: Self-pay | Admitting: Plastic Surgery

## 2023-05-08 ENCOUNTER — Ambulatory Visit (INDEPENDENT_AMBULATORY_CARE_PROVIDER_SITE_OTHER): Payer: BC Managed Care – PPO | Admitting: Plastic Surgery

## 2023-05-08 DIAGNOSIS — D489 Neoplasm of uncertain behavior, unspecified: Secondary | ICD-10-CM

## 2023-05-08 DIAGNOSIS — Z9889 Other specified postprocedural states: Secondary | ICD-10-CM

## 2023-05-08 NOTE — Progress Notes (Signed)
Ms. Donna Melendez underwent a forehead mass excision yesterday evening.  Donna Melendez returns today for a quick evaluation due to bleeding in the operating room.  She states she is doing well and only having mild discomfort in the area.  She does note swelling across her forehead.  Examination there is a small amount of ballotable fluid behind the incision.  But no large hematoma.  I have asked her to keep a compressive dressing on top of the incision until Saturday.  She should wear a bandage over the sutures until she follows up for suture removal next week.

## 2023-05-10 ENCOUNTER — Telehealth: Payer: Self-pay | Admitting: *Deleted

## 2023-05-10 LAB — SURGICAL PATHOLOGY

## 2023-05-10 NOTE — Telephone Encounter (Signed)
Ms. Kottler called to ask if she should wear a dressing and compression over her incision. I advised her to do so until she has her follow-up next week. She states she has noticed some bruising? under her eyes. We discussed this is likely the fluid (noted by Dr. Ladona Ridgel) from her forehead migrating down as her body works to reabsorb it. Also she states she still has some swelling in her hands and feet since surgery (denies Shob, pain, or other Sx). Advised to f/u with her PCP is this does not resolve over the weekend. She voiced understanding of the above

## 2023-05-14 NOTE — Progress Notes (Unsigned)
52 year old female here for follow-up after excision of forehead lesion with Dr. Ladona Ridgel on 05/07/2023.  She is 1 week postop.  Incision was closed with 5-0 Prolene sutures.  Pathology showed nonnecrotizing granulomatous inflammation, no evidence of malignancy.  Additional comments stated: The granulomas are well-formed without any necrosis and may suggest sarcoidosis.  Clinical correlation is suggested.  On exam incision is intact, small amount of subcutaneous fluid collection is noted.  Incision is intact and appears to be healing well.  There is some surrounding scabbing.  Ecchymosis has resolved.  Patient reports she is overall doing well today, she reports that her forehead is swollen after the procedure, she reports a lot of the bruising has resolved.  She reports some mild tenderness but otherwise is doing well.  She was able to discuss pathology today with Dr. Ladona Ridgel, we reviewed the pathology and recommend follow-up with PCP for possible referral to rheumatology given the results.  Patient was understanding of this and agreeable to the plan.  We discussed the importance of sun avoidance to prevent hyperpigmentation of her scar.  We discussed starting massage next week, we also discussed use of silicone scar strips for minimum 18 hours/day for optimal scar healing.  Prolene sutures were removed.  We did attempt to aspirate the forehead where subcutaneous fluid was spelled, a scant amount of blood was aspirated, however the needle became clogged.  No additional attempt was made.  Recommend compression with headband.  Pictures were obtained of the patient and placed in the chart with the patient's or guardian's permission.  Recommend following up with our office in a month to reevaluate, recommend follow-up with PCP for discussion about results and possible rheumatology referral.

## 2023-05-15 ENCOUNTER — Ambulatory Visit (INDEPENDENT_AMBULATORY_CARE_PROVIDER_SITE_OTHER): Payer: BC Managed Care – PPO | Admitting: Surgical

## 2023-05-15 DIAGNOSIS — Z9889 Other specified postprocedural states: Secondary | ICD-10-CM

## 2023-05-15 DIAGNOSIS — D489 Neoplasm of uncertain behavior, unspecified: Secondary | ICD-10-CM

## 2023-05-21 ENCOUNTER — Telehealth: Payer: Self-pay | Admitting: *Deleted

## 2023-05-21 NOTE — Telephone Encounter (Signed)
I called the patient and offered her an appointment this week. Patient otherwise is scheduled for next week for a follow up appointment

## 2023-05-21 NOTE — Telephone Encounter (Signed)
Donna Melendez called with concerns for some residual sutures poking through the scar on her forehead where she had a lesion removed on 05/08/23. She states she tried to pull the suture with tweezers but it was painful and bled. She asked if it is okay to wait to be seen at her appt next week or if she needs to be seen sooner- states "I don't want to mess up my scar". I advised her to send a picture through mychart so one of our providers could see it and advise if she needs to come in sooner. She is going to send one when she is able to.

## 2023-05-22 ENCOUNTER — Ambulatory Visit (INDEPENDENT_AMBULATORY_CARE_PROVIDER_SITE_OTHER): Payer: BC Managed Care – PPO | Admitting: Student

## 2023-05-22 ENCOUNTER — Encounter: Payer: Self-pay | Admitting: Student

## 2023-05-22 VITALS — BP 134/87 | HR 89

## 2023-05-22 DIAGNOSIS — D489 Neoplasm of uncertain behavior, unspecified: Secondary | ICD-10-CM

## 2023-05-22 DIAGNOSIS — L929 Granulomatous disorder of the skin and subcutaneous tissue, unspecified: Secondary | ICD-10-CM

## 2023-05-22 NOTE — Progress Notes (Signed)
Patient is a 52 year old female who with history of a forehead mass.  She underwent excision of the forehead mass with Dr. Ladona Ridgel on 05/07/2023.  Intraoperatively, the mass was excised and the wound was closed in layers with 3-0 Monocryl in the deep layers and a running 5-0 Prolene suture in the skin.  The tissue was sent to pathology.  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  The granulomas were well-formed without any necrosis which may suggest sarcoidosis.  Patient was seen in the clinic last on 05/15/2023.  At this visit, the incision was intact with a small amount of subcutaneous fluid.  There was some surrounding scabbing.  Prolene sutures were removed also at this visit.  Aspiration of the forehead where the subcutaneous fluid was palpated was attempted, but a scant amount of blood was aspirated and the needle became clogged.  Compression was recommended.  Pathology was also discussed with the patient at this visit and it was recommended patient follow-up with her PCP for possible referral to rheumatology given her results.  Scar massage and silicone scar strips were discussed with the patient as well for optimal scar healing.  Patient called the office yesterday with concerns about some sutures that were still remaining in the incision.  She states that there also is still some firmness underneath the surgical site.  Patient presents to the clinic today for evaluation of this.  Today, patient reports she is doing well.  She states that she has a few sutures that are poking out from the incision that are bothering her.  She states that she also feels the surgical site has a "donut like shape" and that there is a little bit of firmness around the central aspect of the surgical site.  She denies any fevers or chills.  She denies any drainage from the incision.  On exam, patient is sitting upright in no acute distress.  There appears to be a little very  minimal amount of subcutaneous fluid within the surgical site.  There is no overlying erythema.  There is no ecchymosis.  There does appear to be some underlying firmness consistent with some scar tissue noted to just superior to the incision.  Incision overall appears to be intact.  There are a few Monocryl sutures that are protruding out from the incision.  These were trimmed and patient tolerated well.  There is no surrounding erythema.  There are no signs of infection on exam.  Discussed with the patient that we could potentially try and aspirate the surgical site for any fluid underneath.  Discussed the risks and benefits of both aspiration and watchful monitoring.  Patient opted for watchful monitoring.  Discussed with the patient that she must continue compression at all times and closely monitor the area.  I discussed with her that if the area becomes bigger, red, tender, or if she has any concerns regarding the area, to reach out to [place].  Patient expressed understanding.  I discussed with the patient would like her to massage the areas of firmness.  Patient expressed understanding.  Discussed with patient that she may apply Vaseline to her incision and that she may utilize silicone scar tape.  Patient expressed understanding.  Encouraged patient to make an appointment with PCP and/or rheumatology as soon as she can.  Patient expressed understanding and stated she will do so.  Patient to follow back up at her neck scheduled appointment the first week of August.  I instructed the patient in  the meantime to call if she has any questions or concerns about anything.  Pictures were obtained of the patient and placed in the chart with the patient's or guardian's permission.

## 2023-05-29 ENCOUNTER — Encounter: Payer: BC Managed Care – PPO | Admitting: Student

## 2023-06-12 ENCOUNTER — Ambulatory Visit (INDEPENDENT_AMBULATORY_CARE_PROVIDER_SITE_OTHER): Payer: BC Managed Care – PPO | Admitting: Plastic Surgery

## 2023-06-12 VITALS — BP 141/94 | HR 86

## 2023-06-12 DIAGNOSIS — Z9889 Other specified postprocedural states: Secondary | ICD-10-CM

## 2023-06-12 NOTE — Progress Notes (Signed)
Ms. Penix returns today for evaluation of the surgical site on her forehead after removal of the granulation tissue.  She states that the discomfort in the area is improving and that she has increasing feeling in the scalp.  She is still unhappy with the overall appearance of the surgical site.  On examination scar is healing well.  There is a contour defect where the mass was removed.  The edges of the excision are still palpable.  I have reassured Mrs. Draney that she is healing well.  I have encouraged her to continue the scar massage and the silicone tape.  She will return to see me after the beginning of the year at which time we will reevaluate her scar the contour defect and her expectations and wishes.  She may benefit from a very small amount of fat grafting in the area to fill in the contour defect where the mass was excised.  I have strongly encouraged her to continue working with her primary care and possibly a rheumatologist to determine the best way forward after the diagnosis of the mass that was removed.  Follow-up after the beginning of the year.

## 2023-11-13 ENCOUNTER — Encounter: Payer: BC Managed Care – PPO | Admitting: Plastic Surgery

## 2024-01-02 ENCOUNTER — Institutional Professional Consult (permissible substitution) (INDEPENDENT_AMBULATORY_CARE_PROVIDER_SITE_OTHER): Payer: BC Managed Care – PPO | Admitting: Otolaryngology
